# Patient Record
Sex: Female | Born: 1940 | Race: White | Hispanic: No | Smoking: Former smoker
Health system: Southern US, Community
[De-identification: ages and names within clinical notes are randomized; demographics above are authoritative.]

## PROBLEM LIST (undated history)

## (undated) DIAGNOSIS — I10 Essential (primary) hypertension: Secondary | ICD-10-CM

## (undated) DIAGNOSIS — E7800 Pure hypercholesterolemia, unspecified: Secondary | ICD-10-CM

## (undated) HISTORY — PX: TUBAL LIGATION: SHX77

## (undated) HISTORY — PX: APPENDECTOMY: SHX54

## (undated) HISTORY — PX: DILATION AND CURETTAGE OF UTERUS: SHX78

## (undated) HISTORY — PX: CHOLECYSTECTOMY: SHX55

## (undated) HISTORY — PX: CARPAL TUNNEL RELEASE: SHX101

---

## 1999-08-11 ENCOUNTER — Ambulatory Visit (HOSPITAL_COMMUNITY): Admission: RE | Admit: 1999-08-11 | Discharge: 1999-08-11 | Payer: Self-pay | Admitting: Cardiology

## 2001-06-16 ENCOUNTER — Encounter: Payer: Self-pay | Admitting: Family Medicine

## 2001-06-16 ENCOUNTER — Ambulatory Visit (HOSPITAL_COMMUNITY): Admission: RE | Admit: 2001-06-16 | Discharge: 2001-06-16 | Payer: Self-pay | Admitting: Family Medicine

## 2003-06-28 ENCOUNTER — Other Ambulatory Visit: Admission: RE | Admit: 2003-06-28 | Discharge: 2003-06-28 | Payer: Self-pay | Admitting: Obstetrics & Gynecology

## 2003-07-01 ENCOUNTER — Ambulatory Visit (HOSPITAL_COMMUNITY): Admission: RE | Admit: 2003-07-01 | Discharge: 2003-07-02 | Payer: Self-pay | Admitting: Obstetrics & Gynecology

## 2004-07-24 ENCOUNTER — Ambulatory Visit (HOSPITAL_COMMUNITY): Admission: RE | Admit: 2004-07-24 | Discharge: 2004-07-24 | Payer: Self-pay | Admitting: Family Medicine

## 2010-06-16 ENCOUNTER — Other Ambulatory Visit (HOSPITAL_COMMUNITY): Payer: Self-pay | Admitting: Family Medicine

## 2010-06-16 DIAGNOSIS — Z139 Encounter for screening, unspecified: Secondary | ICD-10-CM

## 2010-06-20 ENCOUNTER — Ambulatory Visit (HOSPITAL_COMMUNITY)
Admission: RE | Admit: 2010-06-20 | Discharge: 2010-06-20 | Disposition: A | Discharge status: Home / Self Care | Payer: MEDICARE | Source: Ambulatory Visit | Attending: Family Medicine | Admitting: Family Medicine

## 2010-06-20 DIAGNOSIS — Z1231 Encounter for screening mammogram for malignant neoplasm of breast: Secondary | ICD-10-CM | POA: Insufficient documentation

## 2010-06-20 DIAGNOSIS — Z139 Encounter for screening, unspecified: Secondary | ICD-10-CM

## 2011-06-18 ENCOUNTER — Other Ambulatory Visit (HOSPITAL_COMMUNITY): Payer: Self-pay | Admitting: Family Medicine

## 2011-06-18 DIAGNOSIS — Z139 Encounter for screening, unspecified: Secondary | ICD-10-CM

## 2011-06-22 ENCOUNTER — Ambulatory Visit (HOSPITAL_COMMUNITY)
Admission: RE | Admit: 2011-06-22 | Discharge: 2011-06-22 | Disposition: A | Discharge status: Home / Self Care | Payer: Medicare Other | Source: Ambulatory Visit | Attending: Family Medicine | Admitting: Family Medicine

## 2011-06-22 DIAGNOSIS — Z1231 Encounter for screening mammogram for malignant neoplasm of breast: Secondary | ICD-10-CM | POA: Insufficient documentation

## 2011-06-22 DIAGNOSIS — Z139 Encounter for screening, unspecified: Secondary | ICD-10-CM

## 2011-08-07 ENCOUNTER — Encounter (HOSPITAL_COMMUNITY): Payer: Self-pay

## 2011-08-07 ENCOUNTER — Encounter (HOSPITAL_COMMUNITY)
Admission: RE | Admit: 2011-08-07 | Discharge: 2011-08-07 | Disposition: A | Discharge status: Home / Self Care | Payer: Medicare Other | Source: Ambulatory Visit | Attending: Ophthalmology | Admitting: Ophthalmology

## 2011-08-07 HISTORY — DX: Essential (primary) hypertension: I10

## 2011-08-07 HISTORY — DX: Pure hypercholesterolemia, unspecified: E78.00

## 2011-08-07 LAB — BASIC METABOLIC PANEL
BUN: 14 mg/dL (ref 6–23)
Calcium: 9.7 mg/dL (ref 8.4–10.5)
Creatinine, Ser: 0.55 mg/dL (ref 0.50–1.10)
GFR calc Af Amer: >90 mL/min (ref >90)
GFR calc non Af Amer: >90 mL/min (ref >90)

## 2011-08-07 NOTE — Patient Instructions (Addendum)
22 Phyllis Santos  08/07/2011   Your procedure is scheduled on:  Thursday, 08/16/11  Report to [facility] at [place] [facility].  Call this number if you have problems the morning of surgery: [phone]   Remember:   Do not eat food:After Midnight.  May have clear liquids:until Midnight .  Clear liquids include soda, tea, black coffee, apple or grape juice, broth.  Take these medicines the morning of surgery with A SIP OF WATER: hydrodiuril,cozaar   Do not wear jewelry, make-up or nail polish.  Do not wear lotions, powders, or perfumes. You may wear deodorant.  Do not shave 48 hours prior to surgery.  Do not bring valuables to the hospital.  Contacts, dentures or bridgework may not be worn into surgery.  Leave suitcase in the car. After surgery it may be brought to your room.  For patients admitted to the hospital, checkout time is 11:00 AM the day of discharge.   Patients discharged the day of surgery will not be allowed to drive home.  Name and phone number of your driver: driver  Special Instructions: Use eye drops as instructed.   Please read over the following fact sheets that you were given: Pain Booklet, Anesthesia Post-op Instructions and Care and Recovery After Surgery   PATIENT INSTRUCTIONS POST-ANESTHESIA  IMMEDIATELY FOLLOWING SURGERY:  Do not drive or operate machinery for the first twenty four hours after surgery.  Do not make any important decisions for twenty four hours after surgery or while taking narcotic pain medications or sedatives.  If you develop intractable nausea and vomiting or a severe headache please notify your doctor immediately.  FOLLOW-UP:  Please make an appointment with your surgeon as instructed. You do not need to follow up with anesthesia unless specifically instructed to do so.  WOUND CARE INSTRUCTIONS (if applicable):  Keep a dry clean dressing on the anesthesia/puncture wound site if there is drainage.  Once the wound has quit draining you may leave it  open to air.  Generally you should leave the bandage intact for twenty four hours unless there is drainage.  If the epidural site drains for more than 36-48 hours please call the anesthesia department.  QUESTIONS?:  Please feel free to call your physician or the hospital operator if you have any questions, and they will be happy to assist you.          Cataract A cataract is a clouding of the lens of the eye. When a lens becomes cloudy, vision is reduced based on the degree and nature of the clouding. Many cataracts reduce vision to some degree. Some cataracts make people more near-sighted as they develop. Other cataracts increase glare. Cataracts that are ignored and become worse can sometimes look white. The white color can be seen through the pupil. CAUSES   Aging. However, cataracts may occur at any age, even in newborns.   Certain drugs.   Trauma to the eye.   Certain diseases such as diabetes.   Specific eye diseases such as chronic inflammation inside the eye or a sudden attack of a rare form of glaucoma.   Inherited or acquired medical problems.  SYMPTOMS   Gradual, progressive drop in vision in the affected eye.   Severe, rapid visual loss. This most often happens when trauma is the cause.  DIAGNOSIS  To detect a cataract, an eye doctor examines the lens. Cataracts are best diagnosed with an exam of the eyes with the pupils enlarged (dilated) by drops.  TREATMENT  For an early cataract, vision may improve by using different eyeglasses or stronger lighting. If that does not help your vision, surgery is the only effective treatment. A cataract needs to be surgically removed when vision loss interferes with your everyday activities, such as driving, reading, or watching TV. A cataract may also have to be removed if it prevents examination or treatment of another eye problem. Surgery removes the cloudy lens and usually replaces it with a substitute lens (intraocular lens, IOL).    At a time when both you and your doctor agree, the cataract will be surgically removed. If you have cataracts in both eyes, only one is usually removed at a time. This allows the operated eye to heal and be out of danger from any possible problems after surgery (such as infection or poor wound healing). In rare cases, a cataract may be doing damage to your eye. In these cases, your caregiver may advise surgical removal right away. The vast majority of people who have cataract surgery have better vision afterward. HOME CARE INSTRUCTIONS  If you are not planning surgery, you may be asked to do the following:  Use different eyeglasses.   Use stronger or brighter lighting.   Ask your eye doctor about reducing your medicine dose or changing medicines if it is thought that a medicine caused your cataract. Changing medicines does not make the cataract go away on its own.   Become familiar with your surroundings. Poor vision can lead to injury. Avoid bumping into things on the affected side. You are at a higher risk for tripping or falling.   Exercise extreme care when driving or operating machinery.   Wear sunglasses if you are sensitive to bright light or experiencing problems with glare.  SEEK IMMEDIATE MEDICAL CARE IF:   You have a worsening or sudden vision loss.   You notice redness, swelling, or increasing pain in the eye.   You have a fever.  Document Released: 04/30/2005 Document Revised: 04/19/2011 Document Reviewed: 12/22/2010 [facility] Patient Information 2012 [place], [place].

## 2011-08-07 NOTE — Progress Notes (Signed)
08/07/11 1052  OBSTRUCTIVE SLEEP APNEA  Have you ever been diagnosed with sleep apnea through a sleep study? No  Do you snore loudly (loud enough to be heard through closed doors)?  0  Do you often feel tired, fatigued, or sleepy during the daytime? 1  Has anyone observed you stop breathing during your sleep? 0  Do you have, or are you being treated for high blood pressure? 1  BMI more than 35 kg/m2? 1  Age over 69 years old? 1  Neck circumference greater than 40 cm/18 inches? 0  Gender: 0  Obstructive Sleep Apnea Score 4   Score 4 or greater  Updated health history

## 2011-08-08 NOTE — Pre-Procedure Instructions (Signed)
H & H and K 3.2 reported to Dr Jayme Cloud. No orders given.

## 2011-08-15 MED ORDER — TETRACAINE HCL 0.5 % OP SOLN
OPHTHALMIC | Status: AC
  Administered 2011-08-16: 1 [drp] via OPHTHALMIC
  Filled 2011-08-15: qty 2

## 2011-08-15 MED ORDER — CYCLOPENTOLATE-PHENYLEPHRINE 0.2-1 % OP SOLN
OPHTHALMIC | Status: AC
  Administered 2011-08-16: 1 [drp] via OPHTHALMIC
  Filled 2011-08-15: qty 2

## 2011-08-15 MED ORDER — LIDOCAINE HCL 3.5 % OP GEL
OPHTHALMIC | Status: AC
  Filled 2011-08-15: qty 5

## 2011-08-15 MED ORDER — LIDOCAINE HCL (PF) 1 % IJ SOLN
INTRAMUSCULAR | Status: AC
  Filled 2011-08-15: qty 2

## 2011-08-15 MED ORDER — NEOMYCIN-POLYMYXIN-DEXAMETH 3.5-10000-0.1 OP OINT
TOPICAL_OINTMENT | OPHTHALMIC | Status: AC
  Filled 2011-08-15: qty 3.5

## 2011-08-16 ENCOUNTER — Encounter (HOSPITAL_COMMUNITY)
Admission: RE | Disposition: A | Discharge status: Home / Self Care | Payer: Self-pay | Source: Ambulatory Visit | Attending: Ophthalmology

## 2011-08-16 ENCOUNTER — Encounter (HOSPITAL_COMMUNITY): Payer: Self-pay | Admitting: Anesthesiology

## 2011-08-16 ENCOUNTER — Encounter (HOSPITAL_COMMUNITY): Payer: Self-pay | Admitting: *Deleted

## 2011-08-16 ENCOUNTER — Ambulatory Visit (HOSPITAL_COMMUNITY): Payer: Medicare Other | Admitting: Anesthesiology

## 2011-08-16 ENCOUNTER — Ambulatory Visit (HOSPITAL_COMMUNITY)
Admission: RE | Admit: 2011-08-16 | Discharge: 2011-08-16 | Disposition: A | Discharge status: Home / Self Care | Payer: Medicare Other | Source: Ambulatory Visit | Attending: Ophthalmology | Admitting: Ophthalmology

## 2011-08-16 DIAGNOSIS — Z01812 Encounter for preprocedural laboratory examination: Secondary | ICD-10-CM | POA: Insufficient documentation

## 2011-08-16 DIAGNOSIS — I10 Essential (primary) hypertension: Secondary | ICD-10-CM | POA: Insufficient documentation

## 2011-08-16 DIAGNOSIS — Z01810 Encounter for preprocedural cardiovascular examination: Secondary | ICD-10-CM | POA: Insufficient documentation

## 2011-08-16 DIAGNOSIS — H2510 Age-related nuclear cataract, unspecified eye: Secondary | ICD-10-CM | POA: Insufficient documentation

## 2011-08-16 DIAGNOSIS — Z79899 Other long term (current) drug therapy: Secondary | ICD-10-CM | POA: Insufficient documentation

## 2011-08-16 HISTORY — PX: CATARACT EXTRACTION W/PHACO: SHX586

## 2011-08-16 SURGERY — PHACOEMULSIFICATION, CATARACT, WITH IOL INSERTION
Anesthesia: Monitor Anesthesia Care | Site: Eye | Laterality: Left | Wound class: Clean

## 2011-08-16 MED ORDER — EPINEPHRINE HCL 1 MG/ML IJ SOLN
INTRAOCULAR | Status: DC | PRN
  Administered 2011-08-16: 10:00:00

## 2011-08-16 MED ORDER — CYCLOPENTOLATE-PHENYLEPHRINE 0.2-1 % OP SOLN
1.0000 [drp] | OPHTHALMIC | Status: AC
  Administered 2011-08-16 (×3): 1 [drp] via OPHTHALMIC

## 2011-08-16 MED ORDER — MIDAZOLAM HCL 2 MG/2ML IJ SOLN
INTRAMUSCULAR | Status: AC
  Administered 2011-08-16: 2 mg via INTRAVENOUS
  Filled 2011-08-16: qty 2

## 2011-08-16 MED ORDER — ONDANSETRON HCL 4 MG/2ML IJ SOLN: 4.0000 mg | Freq: Once | INTRAMUSCULAR | Status: DC | PRN

## 2011-08-16 MED ORDER — TETRACAINE HCL 0.5 % OP SOLN
1.0000 [drp] | OPHTHALMIC | Status: AC
  Administered 2011-08-16 (×3): 1 [drp] via OPHTHALMIC

## 2011-08-16 MED ORDER — PHENYLEPHRINE HCL 2.5 % OP SOLN
1.0000 [drp] | OPHTHALMIC | Status: AC
  Administered 2011-08-16 (×3): 1 [drp] via OPHTHALMIC

## 2011-08-16 MED ORDER — LIDOCAINE HCL (PF) 1 % IJ SOLN
INTRAMUSCULAR | Status: DC | PRN
  Administered 2011-08-16: .3 mL

## 2011-08-16 MED ORDER — FENTANYL CITRATE 0.05 MG/ML IJ SOLN: 25.0000 ug | INTRAMUSCULAR | Status: DC | PRN

## 2011-08-16 MED ORDER — NEOMYCIN-POLYMYXIN-DEXAMETH 0.1 % OP OINT
TOPICAL_OINTMENT | OPHTHALMIC | Status: DC | PRN
  Administered 2011-08-16: 1 via OPHTHALMIC

## 2011-08-16 MED ORDER — EPINEPHRINE HCL 1 MG/ML IJ SOLN
INTRAMUSCULAR | Status: AC
  Filled 2011-08-16: qty 1

## 2011-08-16 MED ORDER — LACTATED RINGERS IV SOLN
INTRAVENOUS | Status: DC
  Administered 2011-08-16: 1000 mL via INTRAVENOUS

## 2011-08-16 MED ORDER — LIDOCAINE 3.5 % OP GEL OPTIME - NO CHARGE
OPHTHALMIC | Status: DC | PRN
  Administered 2011-08-16: 1 [drp] via OPHTHALMIC

## 2011-08-16 MED ORDER — MIDAZOLAM HCL 2 MG/2ML IJ SOLN: 1.0000 mg | INTRAMUSCULAR | Status: DC | PRN

## 2011-08-16 MED ORDER — POVIDONE-IODINE 5 % OP SOLN
OPHTHALMIC | Status: DC | PRN
  Administered 2011-08-16: 1 via OPHTHALMIC

## 2011-08-16 MED ORDER — PROVISC 10 MG/ML IO SOLN
INTRAOCULAR | Status: DC | PRN
  Administered 2011-08-16: .85 mL via INTRAOCULAR

## 2011-08-16 MED ORDER — PHENYLEPHRINE HCL 2.5 % OP SOLN
OPHTHALMIC | Status: AC
  Administered 2011-08-16: 1 [drp] via OPHTHALMIC
  Filled 2011-08-16: qty 2

## 2011-08-16 MED ORDER — MIDAZOLAM HCL 2 MG/2ML IJ SOLN
1.0000 mg | INTRAMUSCULAR | Status: DC | PRN
  Administered 2011-08-16: 2 mg via INTRAVENOUS

## 2011-08-16 MED ORDER — LACTATED RINGERS IV SOLN
INTRAVENOUS | Status: DC
  Administered 2011-08-16: 10:00:00 via INTRAVENOUS

## 2011-08-16 MED ORDER — LIDOCAINE HCL 3.5 % OP GEL: 1.0000 "application " | Freq: Once | OPHTHALMIC | Status: DC

## 2011-08-16 MED ORDER — BSS IO SOLN
INTRAOCULAR | Status: DC | PRN
  Administered 2011-08-16: 15 mL via INTRAOCULAR

## 2011-08-16 SURGICAL SUPPLY — 32 items

## 2011-08-16 NOTE — Anesthesia Postprocedure Evaluation (Signed)
  Anesthesia Post-op Note  Patient: Phyllis Santos  Procedure(s) Performed: Procedure(s) (LRB): CATARACT EXTRACTION PHACO AND INTRAOCULAR LENS PLACEMENT (IOC) (Left)  Patient Location: PACU and Short Stay  Anesthesia Type: MAC  Level of Consciousness: awake, alert , oriented and patient cooperative  Airway and Oxygen Therapy: Patient Spontanous Breathing  Post-op Pain: none  Post-op Assessment: Post-op Vital signs reviewed, Patient's Cardiovascular Status Stable, Respiratory Function Stable, Patent Airway and No signs of Nausea or vomiting  Post-op Vital Signs: Reviewed and stable  Complications: No apparent anesthesia complications

## 2011-08-16 NOTE — H&P (Signed)
I have reviewed the H&P, the patient was re-examined, and I have identified no interval changes in medical condition and plan of care since the history and physical of record  

## 2011-08-16 NOTE — Discharge Instructions (Signed)
MANILLA STRIETER  08/16/2011     Instructions  1. Use medications as Instructed.  Shake well before use. Wait 5 minutes between drops.  {OPHTHALMIC ANTIBIOTICS:22167} 4 times a day x 1 week.  {OPHTHALMIC ANTI-INFLAMMATORY:22168} 2 times a day x 4 weeks.  {OPHTHALMIC STEROID:22169} 4 times a day - week 1   3 times a day - Week 2, 2 times a day- Week 3, 1 time a day - Week 4.  2. Do not rub the operative eye. Do not swim underwater for 2 weeks.  3. You may remove the clear shield and resume your normal activities the day after  Surgery. Your eyes may feel more comfortable if you wear dark glasses outside.  4. Call our office at [phone] if you have sudden change in vision, extreme redness or pain. Some fluctuation in vision is normal after surgery. If you have an emergency after hours, call Dr. Alto Denver at [phone].  5. It is important that you attend all of your follow-up appointments.        Follow-up:{follow up:32580} with Gemma Payor, MD.   Dr. Lahoma Crocker: [phone]  Dr. Lita Mains: [phone]  Dr. Alto Denver: [phone]   If you find that you cannot contact your physician, but feel that your signs and   Symptoms warrant a physician's attention, call the Emergency Room at   [phone].   Other{NA AND WUXLKGMW:10272}.

## 2011-08-16 NOTE — Anesthesia Preprocedure Evaluation (Signed)
Anesthesia Evaluation  Patient identified by MRN, date of birth, ID band Patient awake    Reviewed: Allergy & Precautions, H&P , NPO status , Patient's Chart, lab work & pertinent test results  History of Anesthesia Complications Negative for: history of anesthetic complications  Airway Mallampati: I      Dental  (+) Edentulous Upper and Edentulous Lower   Pulmonary former smoker breath sounds clear to auscultation        Cardiovascular hypertension, Pt. on medications Rhythm:Regular Rate:Normal     Neuro/Psych    GI/Hepatic   Endo/Other    Renal/GU      Musculoskeletal   Abdominal   Peds  Hematology   Anesthesia Other Findings   Reproductive/Obstetrics                           Anesthesia Physical Anesthesia Plan  ASA: II  Anesthesia Plan: MAC   Post-op Pain Management:    Induction: Intravenous  Airway Management Planned: Nasal Cannula  Additional Equipment:   Intra-op Plan:   Post-operative Plan:   Informed Consent: I have reviewed the patients History and Physical, chart, labs and discussed the procedure including the risks, benefits and alternatives for the proposed anesthesia with the patient or authorized representative who has indicated his/her understanding and acceptance.     Plan Discussed with:   Anesthesia Plan Comments:         Anesthesia Quick Evaluation

## 2011-08-16 NOTE — Transfer of Care (Signed)
Immediate Anesthesia Transfer of Care Note  Patient: Phyllis Santos  Procedure(s) Performed: Procedure(s) (LRB): CATARACT EXTRACTION PHACO AND INTRAOCULAR LENS PLACEMENT (IOC) (Left)  Patient Location: PACU and Short Stay  Anesthesia Type: MAC  Level of Consciousness: awake, alert , oriented and patient cooperative  Airway & Oxygen Therapy: Patient Spontanous Breathing  Post-op Assessment: Report given to PACU RN, Post -op Vital signs reviewed and stable and Patient moving all extremities  Post vital signs: Reviewed and stable  Complications: No apparent anesthesia complications

## 2011-08-16 NOTE — Brief Op Note (Signed)
Pre-Op Dx: Cataract OS Post-Op Dx: Cataract OS Surgeon: Niki Cosman Anesthesia: Topical with MAC Implant: B&L enVista Specimen: None Complications: None 

## 2011-08-17 NOTE — Op Note (Signed)
[name], [name] NO.:  [id number]  MEDICAL RECORD NO.:  [id number]  LOCATION:  [facility]                          FACILITY:  [facility]  PHYSICIAN:  Susanne Greenhouse, MD       DATE OF BIRTH:  Jan 11, 1941  DATE OF PROCEDURE:  08/16/2011 DATE OF DISCHARGE:  08/16/2011                              OPERATIVE REPORT   PREOPERATIVE DIAGNOSIS:  Nuclear cataract, left eye, diagnosis code 366.16.  POSTOPERATIVE DIAGNOSIS:  Nuclear cataract, left eye, diagnosis code 366.16.  OPERATION PERFORMED:  Phacoemulsification with posterior chamber intraocular lens implantation, left eye.  SURGEON:  Susanne Greenhouse, MD  ANESTHESIA:  General endotracheal anesthesia.  OPERATIVE SUMMARY:  In the preoperative area, dilating drops were placed into the left eye.  The patient was then brought into the operating room where she was placed under general anesthesia.  The eye was then prepped and draped.  Beginning with a #75 blade, a paracentesis port was made at the surgeon's 2 o'clock position.  The anterior chamber was then filled with a 1% nonpreserved lidocaine solution with epinephrine.  This was followed by Viscoat to deepen the chamber.  A small fornix-based peritomy was performed superiorly.  Next, a single iris hook was placed through the limbus superiorly.  A 2.4-mm keratome blade was then used to make a clear corneal incision over the iris hook.  A bent cystotome needle and Utrata forceps were used to create a continuous tear capsulotomy.  Hydrodissection was performed using balanced salt solution on a fine cannula.  The lens nucleus was then removed using phacoemulsification in a quadrant cracking technique.  The cortical material was then removed with irrigation and aspiration.  The capsular bag and anterior chamber were refilled with Provisc.  The wound was widened to approximately 3 mm and a posterior chamber intraocular lens was placed into the capsular bag without difficulty using  an [employer] lens injecting system.  A single 10-0 nylon suture was then used to close the incision as well as stromal hydration.  The Provisc was removed from the anterior chamber and capsular bag with irrigation and aspiration.  At this point, the wounds were tested for leak, which were negative.  The anterior chamber remained deep and stable.  The patient tolerated the procedure well.  There were no operative complications, and she awoke from general anesthesia without problem.  No surgical specimens.  Prosthetic device used is a Cabin crew posterior chamber lens, model MX60, power of 20.0, serial number is [id number].          ______________________________ Susanne Greenhouse, MD     KEH/MEDQ  D:  08/16/2011  T:  08/17/2011  Job:  [id number]

## 2011-08-20 ENCOUNTER — Encounter (HOSPITAL_COMMUNITY): Payer: Self-pay | Admitting: Ophthalmology

## 2011-08-24 ENCOUNTER — Encounter (HOSPITAL_COMMUNITY): Payer: Self-pay | Admitting: Pharmacy Technician

## 2011-08-27 ENCOUNTER — Other Ambulatory Visit (HOSPITAL_COMMUNITY): Payer: Medicare Other

## 2011-08-28 ENCOUNTER — Encounter (HOSPITAL_COMMUNITY): Payer: Self-pay

## 2011-08-28 ENCOUNTER — Encounter (HOSPITAL_COMMUNITY)
Admission: RE | Admit: 2011-08-28 | Discharge: 2011-08-28 | Payer: Medicare Other | Source: Ambulatory Visit | Admitting: Ophthalmology

## 2011-08-31 MED ORDER — LIDOCAINE HCL 3.5 % OP GEL
OPHTHALMIC | Status: AC
  Filled 2011-08-31: qty 5

## 2011-08-31 MED ORDER — LIDOCAINE HCL (PF) 1 % IJ SOLN
INTRAMUSCULAR | Status: AC
  Filled 2011-08-31: qty 2

## 2011-08-31 MED ORDER — TETRACAINE HCL 0.5 % OP SOLN
OPHTHALMIC | Status: AC
  Filled 2011-08-31: qty 2

## 2011-08-31 MED ORDER — PHENYLEPHRINE HCL 2.5 % OP SOLN
OPHTHALMIC | Status: AC
  Filled 2011-08-31: qty 2

## 2011-08-31 MED ORDER — NEOMYCIN-POLYMYXIN-DEXAMETH 3.5-10000-0.1 OP OINT
TOPICAL_OINTMENT | OPHTHALMIC | Status: AC
  Filled 2011-08-31: qty 3.5

## 2011-08-31 MED ORDER — CYCLOPENTOLATE-PHENYLEPHRINE 0.2-1 % OP SOLN
OPHTHALMIC | Status: AC
  Filled 2011-08-31: qty 2

## 2011-09-03 ENCOUNTER — Ambulatory Visit (HOSPITAL_COMMUNITY): Payer: Medicare Other | Admitting: Anesthesiology

## 2011-09-03 ENCOUNTER — Encounter (HOSPITAL_COMMUNITY): Payer: Self-pay | Admitting: *Deleted

## 2011-09-03 ENCOUNTER — Encounter (HOSPITAL_COMMUNITY): Payer: Self-pay | Admitting: Anesthesiology

## 2011-09-03 ENCOUNTER — Ambulatory Visit (HOSPITAL_COMMUNITY)
Admission: RE | Admit: 2011-09-03 | Discharge: 2011-09-03 | Disposition: A | Discharge status: Home / Self Care | Payer: Medicare Other | Source: Ambulatory Visit | Attending: Ophthalmology | Admitting: Ophthalmology

## 2011-09-03 ENCOUNTER — Encounter (HOSPITAL_COMMUNITY)
Admission: RE | Disposition: A | Discharge status: Home / Self Care | Payer: Self-pay | Source: Ambulatory Visit | Attending: Ophthalmology

## 2011-09-03 DIAGNOSIS — H2510 Age-related nuclear cataract, unspecified eye: Secondary | ICD-10-CM | POA: Insufficient documentation

## 2011-09-03 DIAGNOSIS — Z79899 Other long term (current) drug therapy: Secondary | ICD-10-CM | POA: Insufficient documentation

## 2011-09-03 DIAGNOSIS — I10 Essential (primary) hypertension: Secondary | ICD-10-CM | POA: Insufficient documentation

## 2011-09-03 HISTORY — PX: CATARACT EXTRACTION W/PHACO: SHX586

## 2011-09-03 SURGERY — PHACOEMULSIFICATION, CATARACT, WITH IOL INSERTION
Anesthesia: Monitor Anesthesia Care | Site: Eye | Laterality: Right | Wound class: Clean

## 2011-09-03 MED ORDER — PHENYLEPHRINE HCL 2.5 % OP SOLN
1.0000 [drp] | OPHTHALMIC | Status: AC
  Administered 2011-09-03 (×3): 1 [drp] via OPHTHALMIC

## 2011-09-03 MED ORDER — LIDOCAINE HCL 3.5 % OP GEL
1.0000 "application " | Freq: Once | OPHTHALMIC | Status: AC
  Administered 2011-09-03: 1 via OPHTHALMIC

## 2011-09-03 MED ORDER — PROVISC 10 MG/ML IO SOLN
INTRAOCULAR | Status: DC | PRN
  Administered 2011-09-03: 8.5 mg via INTRAOCULAR

## 2011-09-03 MED ORDER — EPINEPHRINE HCL 1 MG/ML IJ SOLN
INTRAOCULAR | Status: DC | PRN
  Administered 2011-09-03: 09:00:00

## 2011-09-03 MED ORDER — EPINEPHRINE HCL 1 MG/ML IJ SOLN
INTRAMUSCULAR | Status: AC
  Filled 2011-09-03: qty 1

## 2011-09-03 MED ORDER — POVIDONE-IODINE 5 % OP SOLN
OPHTHALMIC | Status: DC | PRN
  Administered 2011-09-03: 1 via OPHTHALMIC

## 2011-09-03 MED ORDER — ONDANSETRON HCL 4 MG/2ML IJ SOLN: 4.0000 mg | Freq: Once | INTRAMUSCULAR | Status: DC | PRN

## 2011-09-03 MED ORDER — LACTATED RINGERS IV SOLN
INTRAVENOUS | Status: DC
  Administered 2011-09-03: 1000 mL via INTRAVENOUS

## 2011-09-03 MED ORDER — NEOMYCIN-POLYMYXIN-DEXAMETH 0.1 % OP OINT
TOPICAL_OINTMENT | OPHTHALMIC | Status: DC | PRN
  Administered 2011-09-03: 1 via OPHTHALMIC

## 2011-09-03 MED ORDER — BSS IO SOLN
INTRAOCULAR | Status: DC | PRN
  Administered 2011-09-03: 15 mL via INTRAOCULAR

## 2011-09-03 MED ORDER — TETRACAINE HCL 0.5 % OP SOLN
1.0000 [drp] | OPHTHALMIC | Status: AC
  Administered 2011-09-03 (×3): 1 [drp] via OPHTHALMIC

## 2011-09-03 MED ORDER — FENTANYL CITRATE 0.05 MG/ML IJ SOLN: 25.0000 ug | INTRAMUSCULAR | Status: DC | PRN

## 2011-09-03 MED ORDER — LIDOCAINE HCL (PF) 1 % IJ SOLN
INTRAMUSCULAR | Status: DC | PRN
  Administered 2011-09-03: .4 mL

## 2011-09-03 MED ORDER — LIDOCAINE 3.5 % OP GEL OPTIME - NO CHARGE
OPHTHALMIC | Status: DC | PRN
  Administered 2011-09-03: 2 [drp] via OPHTHALMIC

## 2011-09-03 MED ORDER — MIDAZOLAM HCL 2 MG/2ML IJ SOLN
INTRAMUSCULAR | Status: AC
  Administered 2011-09-03: 2 mg via INTRAVENOUS
  Filled 2011-09-03: qty 2

## 2011-09-03 MED ORDER — CYCLOPENTOLATE-PHENYLEPHRINE 0.2-1 % OP SOLN
1.0000 [drp] | OPHTHALMIC | Status: AC
  Administered 2011-09-03 (×3): 1 [drp] via OPHTHALMIC

## 2011-09-03 MED ORDER — MIDAZOLAM HCL 2 MG/2ML IJ SOLN
1.0000 mg | INTRAMUSCULAR | Status: DC | PRN
  Administered 2011-09-03: 2 mg via INTRAVENOUS

## 2011-09-03 SURGICAL SUPPLY — 32 items
CAPSULAR TENSION RING-AMO (OPHTHALMIC RELATED) IMPLANT
CLOTH BEACON ORANGE TIMEOUT ST (SAFETY) ×1 IMPLANT
EYE SHIELD UNIVERSAL CLEAR (GAUZE/BANDAGES/DRESSINGS) ×2 IMPLANT
GLOVE BIO SURGEON STRL SZ 6.5 (GLOVE) IMPLANT
GLOVE BIOGEL PI IND STRL 6.5 (GLOVE) IMPLANT
GLOVE BIOGEL PI IND STRL 7.0 (GLOVE) IMPLANT
GLOVE BIOGEL PI IND STRL 7.5 (GLOVE) IMPLANT
GLOVE BIOGEL PI INDICATOR 6.5 (GLOVE) ×1
GLOVE BIOGEL PI INDICATOR 7.0 (GLOVE)
GLOVE BIOGEL PI INDICATOR 7.5 (GLOVE)
GLOVE ECLIPSE 6.5 STRL STRAW (GLOVE) IMPLANT
GLOVE ECLIPSE 7.0 STRL STRAW (GLOVE) IMPLANT
GLOVE ECLIPSE 7.5 STRL STRAW (GLOVE) IMPLANT
GLOVE EXAM NITRILE LRG STRL (GLOVE) IMPLANT
GLOVE EXAM NITRILE MD LF STRL (GLOVE) ×1 IMPLANT
GLOVE SKINSENSE NS SZ6.5 (GLOVE)
GLOVE SKINSENSE NS SZ7.0 (GLOVE)
GLOVE SKINSENSE STRL SZ6.5 (GLOVE) IMPLANT
GLOVE SKINSENSE STRL SZ7.0 (GLOVE) IMPLANT
KIT VITRECTOMY (OPHTHALMIC RELATED) IMPLANT
PAD ARMBOARD 7.5X6 YLW CONV (MISCELLANEOUS) ×1 IMPLANT
PROC W NO LENS (INTRAOCULAR LENS)
PROC W SPEC LENS (INTRAOCULAR LENS)
PROCESS W NO LENS (INTRAOCULAR LENS) IMPLANT
PROCESS W SPEC LENS (INTRAOCULAR LENS) IMPLANT
RING MALYGIN (MISCELLANEOUS) IMPLANT
SIGHTPATH CAT PROC W REG LENS (Ophthalmic Related) ×2 IMPLANT
SYR TB 1ML LL NO SAFETY (SYRINGE) ×1 IMPLANT
TAPE SURG TRANSPORE 1 IN (GAUZE/BANDAGES/DRESSINGS) IMPLANT
TAPE SURGICAL TRANSPORE 1 IN (GAUZE/BANDAGES/DRESSINGS) ×1
VISCOELASTIC ADDITIONAL (OPHTHALMIC RELATED) IMPLANT
WATER STERILE IRR 250ML POUR (IV SOLUTION) ×1 IMPLANT

## 2011-09-03 NOTE — Transfer of Care (Signed)
Immediate Anesthesia Transfer of Care Note  Patient: Phyllis Santos  Procedure(s) Performed: Procedure(s) (LRB): CATARACT EXTRACTION PHACO AND INTRAOCULAR LENS PLACEMENT (IOC) (Right)  Patient Location: PACU and Short Stay  Anesthesia Type: MAC  Level of Consciousness: awake  Airway & Oxygen Therapy: Patient Spontanous Breathing  Post-op Assessment: Report given to PACU RN  Post vital signs: Reviewed and stable  Complications: No apparent anesthesia complications

## 2011-09-03 NOTE — Op Note (Signed)
Phyllis Santos, Phyllis Santos NO.:  [id number]  MEDICAL RECORD NO.:  [id number]  LOCATION:  [facility]                          FACILITY:  [facility]  PHYSICIAN:  Susanne Greenhouse, MD       DATE OF BIRTH:  09/24/1940  DATE OF PROCEDURE:  09/03/2011 DATE OF DISCHARGE:  09/03/2011                              OPERATIVE REPORT   PREOPERATIVE DIAGNOSIS:  Nuclear cataract, right eye, diagnosis code 366.16.  POSTOPERATIVE DIAGNOSIS:  Nuclear cataract, right eye, diagnosis code 366.16.  OPERATION PERFORMED:  Phacoemulsification with posterior chamber intraocular lens implantation, right eye.  SURGEON:  Bonne Dolores. Skyleigh Windle, MD  ANESTHESIA:  Topical with monitored anesthesia care..  OPERATIVE SUMMARY:  In the preoperative area, dilating drops were placed into the right eye.  The patient was then brought into the operating room where she was placed under general anesthesia.  The eye was then prepped and draped.  Beginning with a 75 blade, a paracentesis port was made at the surgeon's 2 o'clock position.  The anterior chamber was then filled with a 1% nonpreserved lidocaine solution with epinephrine.  This was followed by Viscoat to deepen the chamber.  A small fornix-based peritomy was performed superiorly.  Next, a single iris hook was placed through the limbus superiorly.  A 2.4-mm keratome blade was then used to make a clear corneal incision over the iris hook.  A bent cystotome needle and Utrata forceps were used to create a continuous tear capsulotomy.  Hydrodissection was performed using balanced salt solution on a fine cannula.  The lens nucleus was then removed using phacoemulsification in a quadrant cracking technique.  The cortical material was then removed with irrigation and aspiration.  The capsular bag and anterior chamber were refilled with Provisc.  The wound was widened to approximately 3 mm and a posterior chamber intraocular lens was placed into the capsular bag without  difficulty using an [employer] lens injecting system.  A single 10-0 nylon suture was then used to close the incision as well as stromal hydration.  The Provisc was removed from the anterior chamber and capsular bag with irrigation and aspiration.  At this point, the wounds were tested for leak, which were negative.  The anterior chamber remained deep and stable.  The patient tolerated the procedure well.  There were no operative complications, and she awoke from general anesthesia without problem.  There were no surgical specimens.  Prosthetic device used is a Cabin crew posterior chamber lens, model MX60, power of 20.0, serial number is [id number].          ______________________________ Susanne Greenhouse, MD     KEH/MEDQ  D:  09/03/2011  T:  09/03/2011  Job:  [id number]

## 2011-09-03 NOTE — Anesthesia Postprocedure Evaluation (Signed)
  Anesthesia Post-op Note  Patient: Phyllis Santos  Procedure(s) Performed: Procedure(s) (LRB): CATARACT EXTRACTION PHACO AND INTRAOCULAR LENS PLACEMENT (IOC) (Right)  Patient Location: PACU and Short Stay  Anesthesia Type: MAC  Level of Consciousness: awake, alert  and oriented  Airway and Oxygen Therapy: Patient Spontanous Breathing  Post-op Pain: none  Post-op Assessment: Post-op Vital signs reviewed, Patient's Cardiovascular Status Stable, Respiratory Function Stable, Patent Airway and No signs of Nausea or vomiting  Post-op Vital Signs: Reviewed and stable  Complications: No apparent anesthesia complications

## 2011-09-03 NOTE — Anesthesia Preprocedure Evaluation (Signed)
Anesthesia Evaluation  Patient identified by MRN, date of birth, ID band Patient awake    Reviewed: Allergy & Precautions, H&P , NPO status , Patient's Chart, lab work & pertinent test results  History of Anesthesia Complications Negative for: history of anesthetic complications  Airway Mallampati: I      Dental  (+) Edentulous Upper and Edentulous Lower   Pulmonary former smoker breath sounds clear to auscultation        Cardiovascular hypertension, Pt. on medications Rhythm:Regular Rate:Normal     Neuro/Psych    GI/Hepatic   Endo/Other    Renal/GU      Musculoskeletal   Abdominal   Peds  Hematology   Anesthesia Other Findings   Reproductive/Obstetrics                           Anesthesia Physical Anesthesia Plan  ASA: II  Anesthesia Plan: MAC   Post-op Pain Management:    Induction: Intravenous  Airway Management Planned: Nasal Cannula  Additional Equipment:   Intra-op Plan:   Post-operative Plan:   Informed Consent: I have reviewed the patients History and Physical, chart, labs and discussed the procedure including the risks, benefits and alternatives for the proposed anesthesia with the patient or authorized representative who has indicated his/her understanding and acceptance.     Plan Discussed with:   Anesthesia Plan Comments:         Anesthesia Quick Evaluation  

## 2011-09-03 NOTE — Brief Op Note (Signed)
Pre-Op Dx: Cataract OD Post-Op Dx: Cataract OD Surgeon: Aliahna Statzer Anesthesia: Topical with MAC Implant: B&L enVista Specimen: None Complications: None 

## 2011-09-03 NOTE — H&P (Signed)
I have reviewed the H&P, the patient was re-examined, and I have identified no interval changes in medical condition and plan of care since the history and physical of record  

## 2011-09-03 NOTE — Discharge Instructions (Signed)
Phyllis Santos  09/03/2011     Instructions  1. Use medications as Instructed.  Shake well before use. Wait 5 minutes between drops.  {OPHTHALMIC ANTIBIOTICS:22167} 4 times a day x 1 week.  {OPHTHALMIC ANTI-INFLAMMATORY:22168} 2 times a day x 4 weeks.  {OPHTHALMIC STEROID:22169} 4 times a day - week 1   3 times a day - Week 2, 2 times a day- Week 3, 1 time a day - Week 4.  2. Do not rub the operative eye. Do not swim underwater for 2 weeks.  3. You may remove the clear shield and resume your normal activities the day after  Surgery. Your eyes may feel more comfortable if you wear dark glasses outside.  4. Call our office at [phone] if you have sudden change in vision, extreme redness or pain. Some fluctuation in vision is normal after surgery. If you have an emergency after hours, call Dr. Alto Denver at [phone].  5. It is important that you attend all of your follow-up appointments.        Follow-up:{follow up:32580} with Gemma Payor, MD.   Dr. Lahoma Crocker: [phone]  Dr. Lita Mains: [phone]  Dr. Alto Denver: [phone]   If you find that you cannot contact your physician, but feel that your signs and   Symptoms warrant a physician's attention, call the Emergency Room at   [phone].   Other{NA AND WUXLKGMW:10272}.

## 2011-09-05 ENCOUNTER — Encounter (HOSPITAL_COMMUNITY): Payer: Self-pay | Admitting: Ophthalmology

## 2012-08-11 ENCOUNTER — Other Ambulatory Visit (HOSPITAL_COMMUNITY): Payer: Self-pay | Admitting: Family Medicine

## 2012-08-11 DIAGNOSIS — Z139 Encounter for screening, unspecified: Secondary | ICD-10-CM

## 2012-08-12 ENCOUNTER — Ambulatory Visit (HOSPITAL_COMMUNITY)
Admission: RE | Admit: 2012-08-12 | Discharge: 2012-08-12 | Disposition: A | Discharge status: Home / Self Care | Payer: Medicare Other | Source: Ambulatory Visit | Attending: Family Medicine | Admitting: Family Medicine

## 2012-08-12 DIAGNOSIS — Z139 Encounter for screening, unspecified: Secondary | ICD-10-CM

## 2012-08-12 DIAGNOSIS — Z1231 Encounter for screening mammogram for malignant neoplasm of breast: Secondary | ICD-10-CM | POA: Insufficient documentation

## 2012-08-14 ENCOUNTER — Other Ambulatory Visit (HOSPITAL_COMMUNITY): Payer: Self-pay | Admitting: Family Medicine

## 2012-08-14 DIAGNOSIS — M810 Age-related osteoporosis without current pathological fracture: Secondary | ICD-10-CM

## 2012-08-19 ENCOUNTER — Ambulatory Visit (HOSPITAL_COMMUNITY)
Admission: RE | Admit: 2012-08-19 | Discharge: 2012-08-19 | Disposition: A | Discharge status: Home / Self Care | Payer: Medicare Other | Source: Ambulatory Visit | Attending: Family Medicine | Admitting: Family Medicine

## 2012-08-19 DIAGNOSIS — Z13820 Encounter for screening for osteoporosis: Secondary | ICD-10-CM | POA: Insufficient documentation

## 2012-08-19 DIAGNOSIS — M810 Age-related osteoporosis without current pathological fracture: Secondary | ICD-10-CM

## 2012-08-19 DIAGNOSIS — M899 Disorder of bone, unspecified: Secondary | ICD-10-CM | POA: Insufficient documentation

## 2012-08-19 DIAGNOSIS — Z780 Asymptomatic menopausal state: Secondary | ICD-10-CM | POA: Insufficient documentation

## 2014-03-01 ENCOUNTER — Other Ambulatory Visit (HOSPITAL_COMMUNITY): Payer: Self-pay | Admitting: Family Medicine

## 2014-03-01 DIAGNOSIS — Z1231 Encounter for screening mammogram for malignant neoplasm of breast: Secondary | ICD-10-CM

## 2014-03-08 ENCOUNTER — Ambulatory Visit (HOSPITAL_COMMUNITY)
Admission: RE | Admit: 2014-03-08 | Discharge: 2014-03-08 | Disposition: A | Discharge status: Home / Self Care | Payer: Medicare Other | Source: Ambulatory Visit | Attending: Family Medicine | Admitting: Family Medicine

## 2014-03-08 DIAGNOSIS — Z1231 Encounter for screening mammogram for malignant neoplasm of breast: Secondary | ICD-10-CM | POA: Insufficient documentation

## 2016-04-17 ENCOUNTER — Telehealth: Payer: Self-pay

## 2016-04-17 NOTE — Telephone Encounter (Signed)
Pt called to say that her PCP had referred her to [place] for a colonoscopy. Please call [phone]

## 2016-04-24 ENCOUNTER — Telehealth: Payer: Self-pay

## 2016-04-24 NOTE — Telephone Encounter (Signed)
Pt has OV appt with Walden Field, NP on 04/27/2016 at 10:30 Am.

## 2016-04-24 NOTE — Telephone Encounter (Signed)
See original note of 04/17/2016.

## 2016-04-24 NOTE — Telephone Encounter (Signed)
I have received the referral and called and LMOM for a call from pt.

## 2016-04-24 NOTE — Telephone Encounter (Signed)
I called pt and she said Dr. Julianne Rice office was to send a referral. She had a positive Cologuard and needs colonoscopy.  I called Dr. Julianne Rice office and spoke to [place] and she will be faxing the referral over.

## 2016-04-27 ENCOUNTER — Encounter: Payer: Self-pay | Admitting: Nurse Practitioner

## 2016-04-27 ENCOUNTER — Other Ambulatory Visit: Payer: Self-pay

## 2016-04-27 ENCOUNTER — Ambulatory Visit (INDEPENDENT_AMBULATORY_CARE_PROVIDER_SITE_OTHER): Payer: Medicare Other | Admitting: Nurse Practitioner

## 2016-04-27 DIAGNOSIS — Z800 Family history of malignant neoplasm of digestive organs: Secondary | ICD-10-CM | POA: Diagnosis not present

## 2016-04-27 DIAGNOSIS — R195 Other fecal abnormalities: Secondary | ICD-10-CM

## 2016-04-27 MED ORDER — PEG 3350-KCL-NA BICARB-NACL 420 G PO SOLR: 4000.0000 mL | ORAL | 0 refills | Status: DC

## 2016-04-27 NOTE — Progress Notes (Signed)
Primary Care Physician:  Jani Gravel, MD Primary Gastroenterologist:  Dr. Gala Romney  Chief Complaint  Patient presents with  . Colonoscopy    HPI:   Phyllis Santos is a 75 y.o. female who presents On referral from primary care for positive Colocort test. No history of colonoscopy or endoscopy found in our system.  Today she states she's doing ok overall. She has never had a colonoscopy. States she;s terrified, feels like if she has a colonoscopy she'll leave this world. Reviewed FIT test, noted positive for blood. Denies abdominal pain, N/V, melena. Has seen toilet tissue hematochezia with hemorrhoid flares and straining with constipation. Denies unintnetional weight loss, fever, chills. Has a bowel movement about once a day, occasionally (1-2 times a month) will go every other day; diet dependent. Constipation is rare. Will use Preparation H and/or Miralax if she has a bout of constipation with hemorrhoid flares. Denies chest pain, dyspnea, dizziness, lightheadedness, syncope, near syncope. Denies any other upper or lower GI symptoms.  Past Medical History:  Diagnosis Date  . Hypercholesterolemia   . Hypertension     Past Surgical History:  Procedure Laterality Date  . APPENDECTOMY     Dr Wonda Olds  . CARPAL TUNNEL RELEASE     right-Dr Luna Glasgow  . CATARACT EXTRACTION W/PHACO  08/16/2011   Procedure: CATARACT EXTRACTION PHACO AND INTRAOCULAR LENS PLACEMENT (IOC);  Surgeon: Tonny Branch, MD;  Location: [facility];  Service: Ophthalmology;  Laterality: Left;  CDE: 12.45  . CATARACT EXTRACTION W/PHACO  09/03/2011   Procedure: CATARACT EXTRACTION PHACO AND INTRAOCULAR LENS PLACEMENT (IOC);  Surgeon: Tonny Branch, MD;  Location: [facility];  Service: Ophthalmology;  Laterality: Right;  CDE:13.47  . CHOLECYSTECTOMY     [facility]  . DILATION AND CURETTAGE OF UTERUS     Dr Elonda Husky 3-4 yrs ago  . TUBAL LIGATION     Dr Wonda Olds    Current Outpatient Prescriptions  Medication Sig Dispense Refill  .  aspirin EC 81 MG tablet Take 81 mg by mouth daily.    Marland Kitchen atorvastatin (LIPITOR) 10 MG tablet Take 10 mg by mouth daily.    . Calcium Carbonate-Vitamin D (CALCIUM 600+D) 600-400 MG-UNIT per tablet Take 1 tablet by mouth daily.    . hydrochlorothiazide (HYDRODIURIL) 25 MG tablet Take 25 mg by mouth daily.    Marland Kitchen losartan (COZAAR) 50 MG tablet Take 50 mg by mouth daily.    . Carboxymeth-Glycerin-Polysorb (REFRESH OPTIVE ADVANCED) 0.5-1-0.5 % SOLN Place 1 drop into both eyes daily as needed. For burning eyes    . Naphazoline-Glycerin-Zinc Sulf 0.012-0.2-0.25 % SOLN Place 1 drop into both eyes daily as needed. For burning eyes     No current facility-administered medications for this visit.     Allergies as of 04/27/2016 - Review Complete 04/27/2016  Allergen Reaction Noted  . Codeine Other (See Comments) 08/07/2011  . Levaquin [levofloxacin in d5w] Other (See Comments) 08/16/2011  . Penicillins Rash 08/07/2011    Family History  Problem Relation Age of Onset  . Colon cancer Brother   . Anesthesia problems Neg Hx   . Hypotension Neg Hx   . Malignant hyperthermia Neg Hx   . Pseudochol deficiency Neg Hx     Social History   Social History  . Marital status: Married    Spouse name: N/A  . Number of children: N/A  . Years of education: N/A   Occupational History  . Not on file.   Social History Main Topics  . Smoking status:  Former Smoker    Packs/day: 0.50    Years: 10.00    Types: Cigarettes    Quit date: 08/07/1994  . Smokeless tobacco: Not on file  . Alcohol use No  . Drug use: No  . Sexual activity: Yes    Birth control/ protection: Post-menopausal   Other Topics Concern  . Not on file   Social History Narrative  . No narrative on file    Review of Systems: General: Negative for anorexia, weight loss, fever, chills, fatigue, weakness. Eyes: Negative for vision changes.  ENT: Negative for hoarseness, difficulty swallowing , nasal congestion. CV: Negative for chest  pain, angina, palpitations, dyspnea on exertion, peripheral edema.  Respiratory: Negative for dyspnea at rest, dyspnea on exertion, cough, sputum, wheezing.  GI: See history of present illness. Derm: Negative for rash or itching.  Endo: Negative for unusual weight change.  Heme: Negative for bruising or bleeding. Allergy: Negative for rash or hives.    Physical Exam: BP 137/66   Pulse 70   Temp 98.3 F (36.8 C) (Oral)   Ht 5\' 2"  (1.575 m)   Wt 217 lb (98.4 kg)   BMI 39.69 kg/m  General:   Morbidly obese female. Alert and oriented. Pleasant and cooperative. Well-nourished and well-developed.  Head:  Normocephalic and atraumatic. Eyes:  Without icterus, sclera clear and conjunctiva pink.  Ears:  Normal auditory acuity. Cardiovascular:  S1, S2 present without murmurs appreciated. Respiratory:  Clear to auscultation bilaterally. No wheezes, rales, or rhonchi. No distress.  Gastrointestinal:  +BS, obese but soft, non-tender and non-distended. No HSM noted. No guarding or rebound. No masses appreciated.  Rectal:  Deferred  Musculoskalatal:  Symmetrical without gross deformities. Neurologic:  Alert and oriented x4;  grossly normal neurologically. Psych:  Alert and cooperative. Normal mood and affect. Heme/Lymph/Immune: No excessive bruising noted.    04/27/2016 11:02 AM   Disclaimer: This note was dictated with voice recognition software. Similar sounding words can inadvertently be transcribed and may not be corrected upon review.

## 2016-04-27 NOTE — Assessment & Plan Note (Signed)
Patient noted with heme positive stool on home FIT test completed by house calls visiting nurse practitioner. The patient also has a family history of colon cancer in her brother who was diagnosed in his early 74s, underwent treatment and is now still alive in his 95s. Generally asymptomatic from a GI standpoint other than occasional constipation and hemorrhoid flares which she monitors for herself. Feels her heme positive stools likely due to hemorrhoids. Discussed the possibility of multiple things going on and the strong recommendation that she undergo colonoscopy. Return for follow-up based on postprocedure recommendations.  Proceed with TCS with Dr. Gala Romney in near future: the risks, benefits, and alternatives have been discussed with the patient in detail. The patient states understanding and desires to proceed.  The patient is not on any anticoagulants, anxiolytics, chronic pain medications, or antidepressants. Conscious sedation should be adequate for her procedure.

## 2016-04-27 NOTE — Assessment & Plan Note (Signed)
Family history of colon cancer in her brother diagnosed in his 110s, underwent treatment still alive today in his 90s. She has 10 siblings in total and this is the only one with colon cancer. Given her high risk and positive Cologuard, strongly recommended that she undergo colonoscopy as she has never had one before. We'll plan for colonoscopy as noted above. Return for follow-up based on postprocedure recommendations.

## 2016-04-27 NOTE — Patient Instructions (Signed)
1. We will schedule your procedure for you. 2. Further recommendations to be made based on the results of your procedure. 3. Call us if you have any questions.

## 2016-04-30 ENCOUNTER — Other Ambulatory Visit: Payer: Self-pay

## 2016-04-30 DIAGNOSIS — R195 Other fecal abnormalities: Secondary | ICD-10-CM

## 2016-04-30 DIAGNOSIS — Z800 Family history of malignant neoplasm of digestive organs: Secondary | ICD-10-CM

## 2016-04-30 NOTE — Progress Notes (Signed)
cc'ed to pcp °

## 2016-04-30 NOTE — Patient Instructions (Signed)
PA# for TCS: [id number]

## 2016-05-18 ENCOUNTER — Ambulatory Visit (HOSPITAL_COMMUNITY)
Admission: RE | Admit: 2016-05-18 | Discharge: 2016-05-18 | Disposition: A | Discharge status: Home / Self Care | Payer: Medicare Other | Source: Ambulatory Visit | Attending: Internal Medicine | Admitting: Internal Medicine

## 2016-05-18 ENCOUNTER — Encounter (HOSPITAL_COMMUNITY): Payer: Self-pay | Admitting: *Deleted

## 2016-05-18 ENCOUNTER — Encounter (HOSPITAL_COMMUNITY)
Admission: RE | Disposition: A | Discharge status: Home / Self Care | Payer: Self-pay | Source: Ambulatory Visit | Attending: Internal Medicine

## 2016-05-18 DIAGNOSIS — Z800 Family history of malignant neoplasm of digestive organs: Secondary | ICD-10-CM

## 2016-05-18 DIAGNOSIS — E7800 Pure hypercholesterolemia, unspecified: Secondary | ICD-10-CM | POA: Insufficient documentation

## 2016-05-18 DIAGNOSIS — Z79899 Other long term (current) drug therapy: Secondary | ICD-10-CM | POA: Diagnosis not present

## 2016-05-18 DIAGNOSIS — K621 Rectal polyp: Secondary | ICD-10-CM | POA: Insufficient documentation

## 2016-05-18 DIAGNOSIS — K635 Polyp of colon: Secondary | ICD-10-CM | POA: Diagnosis not present

## 2016-05-18 DIAGNOSIS — K5730 Diverticulosis of large intestine without perforation or abscess without bleeding: Secondary | ICD-10-CM | POA: Diagnosis not present

## 2016-05-18 DIAGNOSIS — D128 Benign neoplasm of rectum: Secondary | ICD-10-CM

## 2016-05-18 DIAGNOSIS — D123 Benign neoplasm of transverse colon: Secondary | ICD-10-CM | POA: Insufficient documentation

## 2016-05-18 DIAGNOSIS — Z87891 Personal history of nicotine dependence: Secondary | ICD-10-CM | POA: Diagnosis not present

## 2016-05-18 DIAGNOSIS — Z7982 Long term (current) use of aspirin: Secondary | ICD-10-CM | POA: Insufficient documentation

## 2016-05-18 DIAGNOSIS — K921 Melena: Secondary | ICD-10-CM | POA: Insufficient documentation

## 2016-05-18 DIAGNOSIS — R195 Other fecal abnormalities: Secondary | ICD-10-CM

## 2016-05-18 DIAGNOSIS — I10 Essential (primary) hypertension: Secondary | ICD-10-CM | POA: Insufficient documentation

## 2016-05-18 HISTORY — PX: COLONOSCOPY: SHX5424

## 2016-05-18 SURGERY — COLONOSCOPY
Anesthesia: Moderate Sedation

## 2016-05-18 MED ORDER — MEPERIDINE HCL 100 MG/ML IJ SOLN
INTRAMUSCULAR | Status: AC
  Filled 2016-05-18: qty 2

## 2016-05-18 MED ORDER — MIDAZOLAM HCL 5 MG/5ML IJ SOLN
INTRAMUSCULAR | Status: AC
  Filled 2016-05-18: qty 10

## 2016-05-18 MED ORDER — MEPERIDINE HCL 100 MG/ML IJ SOLN
INTRAMUSCULAR | Status: DC | PRN
  Administered 2016-05-18: 50 mg via INTRAVENOUS
  Administered 2016-05-18: 25 mg via INTRAVENOUS

## 2016-05-18 MED ORDER — ONDANSETRON HCL 4 MG/2ML IJ SOLN
INTRAMUSCULAR | Status: DC
Start: 2016-05-18 — End: 2016-05-18
  Filled 2016-05-18: qty 2

## 2016-05-18 MED ORDER — ONDANSETRON HCL 4 MG/2ML IJ SOLN
INTRAMUSCULAR | Status: DC | PRN
  Administered 2016-05-18: 4 mg via INTRAVENOUS

## 2016-05-18 MED ORDER — SIMETHICONE 40 MG/0.6ML PO SUSP
ORAL | Status: DC | PRN
  Administered 2016-05-18: 2.5 mL

## 2016-05-18 MED ORDER — MIDAZOLAM HCL 5 MG/5ML IJ SOLN
INTRAMUSCULAR | Status: DC | PRN
  Administered 2016-05-18: 2 mg via INTRAVENOUS
  Administered 2016-05-18 (×3): 1 mg via INTRAVENOUS

## 2016-05-18 MED ORDER — SODIUM CHLORIDE 0.9 % IV SOLN: INTRAVENOUS | Status: DC

## 2016-05-18 NOTE — Op Note (Signed)
[facility] Patient Name: Phyllis Santos Procedure Date: 05/18/2016 2:22 PM MRN: [id number] Date of Birth: [birth year] Attending MD: Norvel Richards , MD CSN: [id number] Age: 76 Admit Type: Outpatient Procedure:                Colonoscopy with multiple snare polypectomy Indications:              Heme positive stool, Positive Cologuard test Providers:                Norvel Richards, MD, Charlyne Petrin RN, RN,                            Lurline Del, RN, Purcell Nails. Zebulon, Merchant navy officer Referring MD:             Teodora Medici. Kim Medicines:                Midazolam 5 mg IV, Meperidine 75 mg IV, Ondansetron                            4 mg IV Complications:            No immediate complications. Estimated Blood Loss:     Estimated blood loss was minimal. Procedure:                Pre-Anesthesia Assessment:                           - Prior to the procedure, a History and Physical                            was performed, and patient medications and                            allergies were reviewed. The patient's tolerance of                            previous anesthesia was also reviewed. The risks                            and benefits of the procedure and the sedation                            options and risks were discussed with the patient.                            All questions were answered, and informed consent                            was obtained. Prior Anticoagulants: The patient has                            taken no previous anticoagulant or antiplatelet                            agents. ASA Grade Assessment: II - A patient with  mild systemic disease. After reviewing the risks                            and benefits, the patient was deemed in                            satisfactory condition to undergo the procedure.                           After obtaining informed consent, the colonoscope                            was passed under  direct vision. Throughout the                            procedure, the patient's blood pressure, pulse, and                            oxygen saturations were monitored continuously. The                            colonoscopy was performed without difficulty. The                            patient tolerated the procedure well. The quality                            of the bowel preparation was adequate. The                            [id number] scope was introduced through                            the and advanced to the 5 cm into the ileum. The                            terminal ileum, ileocecal valve, appendiceal                            orifice, and rectum were photographed. Scope In: 3:02:04 PM Scope Out: 3:27:14 PM Scope Withdrawal Time: 0 hours 19 minutes 45 seconds  Total Procedure Duration: 0 hours 25 minutes 10 seconds  Findings:      The perianal and digital rectal examinations were normal.      A 7 mm polyp was found in the hepatic flexure. The polyp was       semi-pedunculated. The polyp was removed with a hot snare. Resection and       retrieval were complete. Estimated blood loss: none.      A 5 mm polyp was found in the rectum. The polyp was sessile. The polyp       was removed with a hot snare. Resection and retrieval were complete.      A 4 mmpolyp was found in the distal transverse colon. The polyp was       sessile. The polyp was removed  with a cold snare. Resection and       retrieval were complete. Estimated blood loss was minimal.      Multiple small and large-mouthed diverticula were found in the sigmoid       colon, descending colon and transverse colon.      The perianal and digital rectal examinations were normal.      No additional abnormalities were found on retroflexion. Impression:               - One 7 mm polyp at the hepatic flexure, removed                            with a hot snare. Resected and retrieved.                           - One  5 mm polyp in the rectum, removed with a hot                            snare. Resected and retrieved.                           - One polyp in the distal transverse colon, removed                            with a cold snare. Resected and retrieved.                           - Diverticulosis in the sigmoid colon, in the                            descending colon and in the transverse colon. Moderate Sedation:      Moderate (conscious) sedation was personally administered by an       anesthesia professional. The following parameters were monitored: oxygen       saturation, heart rate, blood pressure, respiratory rate, EKG, adequacy       of pulmonary ventilation, and response to care. Total physician       intraservice time was 30 minutes. Recommendation:           - Patient has a contact number available for                            emergencies. The signs and symptoms of potential                            delayed complications were discussed with the                            patient. Return to normal activities tomorrow.                            Written discharge instructions were provided to the                            patient.                           -  Resume previous diet.                           - Continue present medications.                           - Repeat colonoscopy date to be determined after                            pending pathology results are reviewed for                            surveillance based on pathology results.                           - Return to GI office (date not yet determined). Procedure Code(s):        --- Professional ---                           [phone], Colonoscopy, flexible; with removal of                            tumor(s), polyp(s), or other lesion(s) by snare                            technique Diagnosis Code(s):        --- Professional ---                           K62.1, Rectal polyp                           D12.3, Benign  neoplasm of transverse colon (hepatic                            flexure or splenic flexure)                           R19.5, Other fecal abnormalities                           K57.30, Diverticulosis of large intestine without                            perforation or abscess without bleeding CPT copyright 2016 American Medical Association. All rights reserved. The codes documented in this report are preliminary and upon coder review may  be revised to meet current compliance requirements. Cristopher Estimable. Dylann Layne, MD Norvel Richards, MD 05/18/2016 3:42:43 PM This report has been signed electronically. Number of Addenda: 0

## 2016-05-18 NOTE — Interval H&P Note (Signed)
History and Physical Interval Note:  05/18/2016 2:49 PM  Phyllis Santos  has presented today for surgery, with the diagnosis of heme positive stool, family history colon cancer  The various methods of treatment have been discussed with the patient and family. After consideration of risks, benefits and other options for treatment, the patient has consented to  Procedure(s) with comments: COLONOSCOPY (N/A) - 2:45pm as a surgical intervention .  The patient's history has been reviewed, patient examined, no change in status, stable for surgery.  I have reviewed the patient's chart and labs.  Questions were answered to the patient's satisfaction.     Phyllis Santos  No change. Diagnostic colonoscopy per plan.  The risks, benefits, limitations, alternatives and imponderables have been reviewed with the patient. Questions have been answered. All parties are agreeable.

## 2016-05-18 NOTE — H&P (View-Only) (Signed)
Primary Care Physician:  Jani Gravel, MD Primary Gastroenterologist:  Dr. Gala Romney  Chief Complaint  Patient presents with  . Colonoscopy    HPI:   Phyllis Santos is a 76 y.o. female who presents On referral from primary care for positive Colocort test. No history of colonoscopy or endoscopy found in our system.  Today she states she's doing ok overall. She has never had a colonoscopy. States she;s terrified, feels like if she has a colonoscopy she'll leave this world. Reviewed FIT test, noted positive for blood. Denies abdominal pain, N/V, melena. Has seen toilet tissue hematochezia with hemorrhoid flares and straining with constipation. Denies unintnetional weight loss, fever, chills. Has a bowel movement about once a day, occasionally (1-2 times a month) will go every other day; diet dependent. Constipation is rare. Will use Preparation H and/or Miralax if she has a bout of constipation with hemorrhoid flares. Denies chest pain, dyspnea, dizziness, lightheadedness, syncope, near syncope. Denies any other upper or lower GI symptoms.  Past Medical History:  Diagnosis Date  . Hypercholesterolemia   . Hypertension     Past Surgical History:  Procedure Laterality Date  . APPENDECTOMY     Dr Wonda Olds  . CARPAL TUNNEL RELEASE     right-Dr Luna Glasgow  . CATARACT EXTRACTION W/PHACO  08/16/2011   Procedure: CATARACT EXTRACTION PHACO AND INTRAOCULAR LENS PLACEMENT (IOC);  Surgeon: Tonny Branch, MD;  Location: [facility];  Service: Ophthalmology;  Laterality: Left;  CDE: 12.45  . CATARACT EXTRACTION W/PHACO  09/03/2011   Procedure: CATARACT EXTRACTION PHACO AND INTRAOCULAR LENS PLACEMENT (IOC);  Surgeon: Tonny Branch, MD;  Location: [facility];  Service: Ophthalmology;  Laterality: Right;  CDE:13.47  . CHOLECYSTECTOMY     [facility]  . DILATION AND CURETTAGE OF UTERUS     Dr Elonda Husky 3-4 yrs ago  . TUBAL LIGATION     Dr Wonda Olds    Current Outpatient Prescriptions  Medication Sig Dispense Refill  .  aspirin EC 81 MG tablet Take 81 mg by mouth daily.    Marland Kitchen atorvastatin (LIPITOR) 10 MG tablet Take 10 mg by mouth daily.    . Calcium Carbonate-Vitamin D (CALCIUM 600+D) 600-400 MG-UNIT per tablet Take 1 tablet by mouth daily.    . hydrochlorothiazide (HYDRODIURIL) 25 MG tablet Take 25 mg by mouth daily.    Marland Kitchen losartan (COZAAR) 50 MG tablet Take 50 mg by mouth daily.    . Carboxymeth-Glycerin-Polysorb (REFRESH OPTIVE ADVANCED) 0.5-1-0.5 % SOLN Place 1 drop into both eyes daily as needed. For burning eyes    . Naphazoline-Glycerin-Zinc Sulf 0.012-0.2-0.25 % SOLN Place 1 drop into both eyes daily as needed. For burning eyes     No current facility-administered medications for this visit.     Allergies as of 04/27/2016 - Review Complete 04/27/2016  Allergen Reaction Noted  . Codeine Other (See Comments) 08/07/2011  . Levaquin [levofloxacin in d5w] Other (See Comments) 08/16/2011  . Penicillins Rash 08/07/2011    Family History  Problem Relation Age of Onset  . Colon cancer Brother   . Anesthesia problems Neg Hx   . Hypotension Neg Hx   . Malignant hyperthermia Neg Hx   . Pseudochol deficiency Neg Hx     Social History   Social History  . Marital status: Married    Spouse name: N/A  . Number of children: N/A  . Years of education: N/A   Occupational History  . Not on file.   Social History Main Topics  . Smoking status:  Former Smoker    Packs/day: 0.50    Years: 10.00    Types: Cigarettes    Quit date: 08/07/1994  . Smokeless tobacco: Not on file  . Alcohol use No  . Drug use: No  . Sexual activity: Yes    Birth control/ protection: Post-menopausal   Other Topics Concern  . Not on file   Social History Narrative  . No narrative on file    Review of Systems: General: Negative for anorexia, weight loss, fever, chills, fatigue, weakness. Eyes: Negative for vision changes.  ENT: Negative for hoarseness, difficulty swallowing , nasal congestion. CV: Negative for chest  pain, angina, palpitations, dyspnea on exertion, peripheral edema.  Respiratory: Negative for dyspnea at rest, dyspnea on exertion, cough, sputum, wheezing.  GI: See history of present illness. Derm: Negative for rash or itching.  Endo: Negative for unusual weight change.  Heme: Negative for bruising or bleeding. Allergy: Negative for rash or hives.    Physical Exam: BP 137/66   Pulse 70   Temp 98.3 F (36.8 C) (Oral)   Ht 5\' 2"  (1.575 m)   Wt 217 lb (98.4 kg)   BMI 39.69 kg/m  General:   Morbidly obese female. Alert and oriented. Pleasant and cooperative. Well-nourished and well-developed.  Head:  Normocephalic and atraumatic. Eyes:  Without icterus, sclera clear and conjunctiva pink.  Ears:  Normal auditory acuity. Cardiovascular:  S1, S2 present without murmurs appreciated. Respiratory:  Clear to auscultation bilaterally. No wheezes, rales, or rhonchi. No distress.  Gastrointestinal:  +BS, obese but soft, non-tender and non-distended. No HSM noted. No guarding or rebound. No masses appreciated.  Rectal:  Deferred  Musculoskalatal:  Symmetrical without gross deformities. Neurologic:  Alert and oriented x4;  grossly normal neurologically. Psych:  Alert and cooperative. Normal mood and affect. Heme/Lymph/Immune: No excessive bruising noted.    04/27/2016 11:02 AM   Disclaimer: This note was dictated with voice recognition software. Similar sounding words can inadvertently be transcribed and may not be corrected upon review.

## 2016-05-18 NOTE — Discharge Instructions (Addendum)
Colonoscopy Discharge Instructions  Read the instructions outlined below and refer to this sheet in the next few weeks. These discharge instructions provide you with general information on caring for yourself after you leave the hospital. Your doctor may also give you specific instructions. While your treatment has been planned according to the most current medical practices available, unavoidable complications occasionally occur. If you have any problems or questions after discharge, call Dr. Gala Romney at [phone]. ACTIVITY  You may resume your regular activity, but move at a slower pace for the next 24 hours.   Take frequent rest periods for the next 24 hours.   Walking will help get rid of the air and reduce the bloated feeling in your belly (abdomen).   No driving for 24 hours (because of the medicine (anesthesia) used during the test).    Do not sign any important legal documents or operate any machinery for 24 hours (because of the anesthesia used during the test).  NUTRITION  Drink plenty of fluids.   You may resume your normal diet as instructed by your doctor.   Begin with a light meal and progress to your normal diet. Heavy or fried foods are harder to digest and may make you feel sick to your stomach (nauseated).   Avoid alcoholic beverages for 24 hours or as instructed.  MEDICATIONS  You may resume your normal medications unless your doctor tells you otherwise.  WHAT YOU CAN EXPECT TODAY  Some feelings of bloating in the abdomen.   Passage of more gas than usual.   Spotting of blood in your stool or on the toilet paper.  IF YOU HAD POLYPS REMOVED DURING THE COLONOSCOPY:  No aspirin products for 7 days or as instructed.   No alcohol for 7 days or as instructed.   Eat a soft diet for the next 24 hours.  FINDING OUT THE RESULTS OF YOUR TEST Not all test results are available during your visit. If your test results are not back during the visit, make an appointment  with your caregiver to find out the results. Do not assume everything is normal if you have not heard from your caregiver or the medical facility. It is important for you to follow up on all of your test results.  SEEK IMMEDIATE MEDICAL ATTENTION IF:  You have more than a spotting of blood in your stool.   Your belly is swollen (abdominal distention).   You are nauseated or vomiting.   You have a temperature over 101.   You have abdominal pain or discomfort that is severe or gets worse throughout the day.    Diverticulosis and polyp information provided  Further recommendations to follow pending review of pathology report.    Diverticulosis Diverticulosis is the condition that develops when small pouches (diverticula) form in the wall of your colon. Your colon, or large intestine, is where water is absorbed and stool is formed. The pouches form when the inside layer of your colon pushes through weak spots in the outer layers of your colon. CAUSES  No one knows exactly what causes diverticulosis. RISK FACTORS  Being older than 47. Your risk for this condition increases with age. Diverticulosis is rare in people younger than 40 years. By age 71, almost everyone has it.  Eating a low-fiber diet.  Being frequently constipated.  Being overweight.  Not getting enough exercise.  Smoking.  Taking over-the-counter pain medicines, like aspirin and ibuprofen. SYMPTOMS  Most people with diverticulosis do not have symptoms. DIAGNOSIS  Because diverticulosis often has no symptoms, health care providers often discover the condition during an exam for other colon problems. In many cases, a health care provider will diagnose diverticulosis while using a flexible scope to examine the colon (colonoscopy). TREATMENT  If you have never developed an infection related to diverticulosis, you may not need treatment. If you have had an infection before, treatment may include:  Eating more fruits,  vegetables, and grains.  Taking a fiber supplement.  Taking a live bacteria supplement (probiotic).  Taking medicine to relax your colon. HOME CARE INSTRUCTIONS   Drink at least 6-8 glasses of water each day to prevent constipation.  Try not to strain when you have a bowel movement.  Keep all follow-up appointments. If you have had an infection before:  Increase the fiber in your diet as directed by your health care provider or dietitian.  Take a dietary fiber supplement if your health care provider approves.  Only take medicines as directed by your health care provider. SEEK MEDICAL CARE IF:   You have abdominal pain.  You have bloating.  You have cramps.  You have not gone to the bathroom in 3 days. SEEK IMMEDIATE MEDICAL CARE IF:   Your pain gets worse.  Yourbloating becomes very bad.  You have a fever or chills, and your symptoms suddenly get worse.  You begin vomiting.  You have bowel movements that are bloody or black. MAKE SURE YOU:  Understand these instructions.  Will watch your condition.  Will get help right away if you are not doing well or get worse. This information is not intended to replace advice given to you by your health care provider. Make sure you discuss any questions you have with your health care provider. Document Released: 01/26/2004 Document Revised: 05/05/2013 Document Reviewed: 03/25/2013 Elsevier Interactive Patient Education  2017 [employer] [place].    Colon Polyps Introduction Polyps are tissue growths inside the body. Polyps can grow in many places, including the large intestine (colon). A polyp may be a round bump or a mushroom-shaped growth. You could have one polyp or several. Most colon polyps are noncancerous (benign). However, some colon polyps can become cancerous over time. What are the causes? The exact cause of colon polyps is not known. What increases the risk? This condition is more likely to develop in people  who:  Have a family history of colon cancer or colon polyps.  Are older than 82 or older than 45 if they are African American.  Have inflammatory bowel disease, such as ulcerative colitis or Crohn disease.  Are overweight.  Smoke cigarettes.  Do not get enough exercise.  Drink too much alcohol.  Eat a diet that is:  High in fat and red meat.  Low in fiber.  Had childhood cancer that was treated with abdominal radiation. What are the signs or symptoms? Most polyps do not cause symptoms. If you have symptoms, they may include:  Blood coming from your rectum when having a bowel movement.  Blood in your stool.The stool may look dark red or black.  A change in bowel habits, such as constipation or diarrhea. How is this diagnosed? This condition is diagnosed with a colonoscopy. This is a procedure that uses a lighted, flexible scope to look at the inside of your colon. How is this treated? Treatment for this condition involves removing any polyps that are found. Those polyps will then be tested for cancer. If cancer is found, your health care provider will talk to  options for colon cancer treatment. °Follow these instructions at home: °Diet °· Eat plenty of fiber, such as fruits, vegetables, and whole grains. °· Eat foods that are high in calcium and vitamin D, such as milk, cheese, yogurt, eggs, liver, fish, and broccoli. °· Limit foods high in fat, red meats, and processed meats, such as hot dogs, sausage, bacon, and lunch meats. °· Maintain a healthy weight, or lose weight if recommended by your health care provider. °General instructions °· Do not smoke cigarettes. °· Do not drink alcohol excessively. °· Keep all follow-up visits as told by your health care provider. This is important. This includes keeping regularly scheduled colonoscopies. Talk to your health care provider about when you need a colonoscopy. °· Exercise every day or as told by your health care  provider. °Contact a health care provider if: °· You have new or worsening bleeding during a bowel movement. °· You have new or increased blood in your stool. °· You have a change in bowel habits. °· You unexpectedly lose weight. °This information is not intended to replace advice given to you by your health care provider. Make sure you discuss any questions you have with your health care provider. °Document Released: 01/25/2004 Document Revised: 10/06/2015 Document Reviewed: 03/21/2015 °© 2017 Elsevier ° °

## 2016-05-22 ENCOUNTER — Encounter (HOSPITAL_COMMUNITY): Payer: Self-pay | Admitting: Internal Medicine

## 2016-05-26 ENCOUNTER — Encounter: Payer: Self-pay | Admitting: Internal Medicine

## 2016-06-27 DIAGNOSIS — I10 Essential (primary) hypertension: Secondary | ICD-10-CM | POA: Diagnosis not present

## 2016-06-27 DIAGNOSIS — E785 Hyperlipidemia, unspecified: Secondary | ICD-10-CM | POA: Diagnosis not present

## 2016-07-09 DIAGNOSIS — I10 Essential (primary) hypertension: Secondary | ICD-10-CM | POA: Diagnosis not present

## 2016-07-11 DIAGNOSIS — E785 Hyperlipidemia, unspecified: Secondary | ICD-10-CM | POA: Diagnosis not present

## 2016-07-11 DIAGNOSIS — I10 Essential (primary) hypertension: Secondary | ICD-10-CM | POA: Diagnosis not present

## 2016-07-11 DIAGNOSIS — Z0000 Encounter for general adult medical examination without abnormal findings: Secondary | ICD-10-CM | POA: Diagnosis not present

## 2017-01-08 DIAGNOSIS — I10 Essential (primary) hypertension: Secondary | ICD-10-CM | POA: Diagnosis not present

## 2017-01-10 DIAGNOSIS — I10 Essential (primary) hypertension: Secondary | ICD-10-CM | POA: Diagnosis not present

## 2017-01-10 DIAGNOSIS — E785 Hyperlipidemia, unspecified: Secondary | ICD-10-CM | POA: Diagnosis not present

## 2017-01-10 DIAGNOSIS — K5900 Constipation, unspecified: Secondary | ICD-10-CM | POA: Diagnosis not present

## 2017-07-09 DIAGNOSIS — E785 Hyperlipidemia, unspecified: Secondary | ICD-10-CM | POA: Diagnosis not present

## 2017-07-09 DIAGNOSIS — I10 Essential (primary) hypertension: Secondary | ICD-10-CM | POA: Diagnosis not present

## 2017-07-25 DIAGNOSIS — K5900 Constipation, unspecified: Secondary | ICD-10-CM | POA: Diagnosis not present

## 2017-07-25 DIAGNOSIS — E782 Mixed hyperlipidemia: Secondary | ICD-10-CM | POA: Diagnosis not present

## 2017-07-25 DIAGNOSIS — R7301 Impaired fasting glucose: Secondary | ICD-10-CM | POA: Diagnosis not present

## 2017-07-25 DIAGNOSIS — I10 Essential (primary) hypertension: Secondary | ICD-10-CM | POA: Diagnosis not present

## 2018-01-03 ENCOUNTER — Ambulatory Visit (HOSPITAL_COMMUNITY)
Admission: RE | Admit: 2018-01-03 | Discharge: 2018-01-03 | Disposition: A | Discharge status: Home / Self Care | Payer: Medicare Other | Source: Ambulatory Visit | Attending: Internal Medicine | Admitting: Internal Medicine

## 2018-01-03 ENCOUNTER — Other Ambulatory Visit: Payer: Self-pay | Admitting: Internal Medicine

## 2018-01-03 ENCOUNTER — Other Ambulatory Visit (HOSPITAL_COMMUNITY): Payer: Self-pay | Admitting: Internal Medicine

## 2018-01-03 DIAGNOSIS — N6452 Nipple discharge: Secondary | ICD-10-CM

## 2018-01-03 DIAGNOSIS — N644 Mastodynia: Secondary | ICD-10-CM

## 2018-01-03 DIAGNOSIS — R928 Other abnormal and inconclusive findings on diagnostic imaging of breast: Secondary | ICD-10-CM | POA: Diagnosis not present

## 2018-01-03 DIAGNOSIS — N63 Unspecified lump in unspecified breast: Secondary | ICD-10-CM

## 2018-01-03 DIAGNOSIS — N6459 Other signs and symptoms in breast: Secondary | ICD-10-CM | POA: Diagnosis not present

## 2018-01-07 ENCOUNTER — Ambulatory Visit (HOSPITAL_COMMUNITY)
Admission: RE | Admit: 2018-01-07 | Discharge: 2018-01-07 | Disposition: A | Discharge status: Home / Self Care | Payer: Medicare Other | Source: Ambulatory Visit | Attending: Internal Medicine | Admitting: Internal Medicine

## 2018-01-07 DIAGNOSIS — N6452 Nipple discharge: Secondary | ICD-10-CM | POA: Diagnosis not present

## 2018-01-07 DIAGNOSIS — N644 Mastodynia: Secondary | ICD-10-CM

## 2018-01-07 DIAGNOSIS — R922 Inconclusive mammogram: Secondary | ICD-10-CM | POA: Diagnosis not present

## 2018-01-28 DIAGNOSIS — E782 Mixed hyperlipidemia: Secondary | ICD-10-CM | POA: Diagnosis not present

## 2018-01-28 DIAGNOSIS — R7301 Impaired fasting glucose: Secondary | ICD-10-CM | POA: Diagnosis not present

## 2018-01-28 DIAGNOSIS — I10 Essential (primary) hypertension: Secondary | ICD-10-CM | POA: Diagnosis not present

## 2018-01-28 DIAGNOSIS — L219 Seborrheic dermatitis, unspecified: Secondary | ICD-10-CM | POA: Diagnosis not present

## 2018-01-28 DIAGNOSIS — Z0001 Encounter for general adult medical examination with abnormal findings: Secondary | ICD-10-CM | POA: Diagnosis not present

## 2018-02-04 ENCOUNTER — Other Ambulatory Visit (HOSPITAL_COMMUNITY): Payer: Self-pay | Admitting: Internal Medicine

## 2018-02-04 DIAGNOSIS — Z780 Asymptomatic menopausal state: Secondary | ICD-10-CM

## 2018-02-10 ENCOUNTER — Ambulatory Visit (HOSPITAL_COMMUNITY)
Admission: RE | Admit: 2018-02-10 | Discharge: 2018-02-10 | Disposition: A | Discharge status: Home / Self Care | Payer: Medicare Other | Source: Ambulatory Visit | Attending: Internal Medicine | Admitting: Internal Medicine

## 2018-02-10 DIAGNOSIS — R7301 Impaired fasting glucose: Secondary | ICD-10-CM | POA: Insufficient documentation

## 2018-02-10 DIAGNOSIS — E785 Hyperlipidemia, unspecified: Secondary | ICD-10-CM | POA: Insufficient documentation

## 2018-02-10 DIAGNOSIS — Z780 Asymptomatic menopausal state: Secondary | ICD-10-CM | POA: Insufficient documentation

## 2018-02-10 DIAGNOSIS — I10 Essential (primary) hypertension: Secondary | ICD-10-CM | POA: Insufficient documentation

## 2018-02-10 DIAGNOSIS — M85852 Other specified disorders of bone density and structure, left thigh: Secondary | ICD-10-CM | POA: Insufficient documentation

## 2018-02-26 DIAGNOSIS — R7301 Impaired fasting glucose: Secondary | ICD-10-CM | POA: Diagnosis not present

## 2018-02-26 DIAGNOSIS — I10 Essential (primary) hypertension: Secondary | ICD-10-CM | POA: Diagnosis not present

## 2018-02-26 DIAGNOSIS — E782 Mixed hyperlipidemia: Secondary | ICD-10-CM | POA: Diagnosis not present

## 2018-03-03 DIAGNOSIS — Z23 Encounter for immunization: Secondary | ICD-10-CM | POA: Diagnosis not present

## 2018-03-03 DIAGNOSIS — J069 Acute upper respiratory infection, unspecified: Secondary | ICD-10-CM | POA: Diagnosis not present

## 2018-03-14 DIAGNOSIS — J209 Acute bronchitis, unspecified: Secondary | ICD-10-CM | POA: Diagnosis not present

## 2018-03-14 DIAGNOSIS — I10 Essential (primary) hypertension: Secondary | ICD-10-CM | POA: Diagnosis not present

## 2018-07-21 DIAGNOSIS — J069 Acute upper respiratory infection, unspecified: Secondary | ICD-10-CM | POA: Diagnosis not present

## 2018-07-21 DIAGNOSIS — R05 Cough: Secondary | ICD-10-CM | POA: Diagnosis not present

## 2018-08-05 DIAGNOSIS — I10 Essential (primary) hypertension: Secondary | ICD-10-CM | POA: Diagnosis not present

## 2018-08-05 DIAGNOSIS — E782 Mixed hyperlipidemia: Secondary | ICD-10-CM | POA: Diagnosis not present

## 2018-08-05 DIAGNOSIS — R7301 Impaired fasting glucose: Secondary | ICD-10-CM | POA: Diagnosis not present

## 2018-08-05 DIAGNOSIS — K5900 Constipation, unspecified: Secondary | ICD-10-CM | POA: Diagnosis not present

## 2018-09-02 DIAGNOSIS — E782 Mixed hyperlipidemia: Secondary | ICD-10-CM | POA: Diagnosis not present

## 2018-09-02 DIAGNOSIS — R7301 Impaired fasting glucose: Secondary | ICD-10-CM | POA: Diagnosis not present

## 2018-09-02 DIAGNOSIS — I10 Essential (primary) hypertension: Secondary | ICD-10-CM | POA: Diagnosis not present

## 2018-09-11 DIAGNOSIS — Z0000 Encounter for general adult medical examination without abnormal findings: Secondary | ICD-10-CM | POA: Diagnosis not present

## 2018-11-04 DIAGNOSIS — R7301 Impaired fasting glucose: Secondary | ICD-10-CM | POA: Diagnosis not present

## 2018-11-04 DIAGNOSIS — E782 Mixed hyperlipidemia: Secondary | ICD-10-CM | POA: Diagnosis not present

## 2018-11-04 DIAGNOSIS — I10 Essential (primary) hypertension: Secondary | ICD-10-CM | POA: Diagnosis not present

## 2018-11-10 DIAGNOSIS — I10 Essential (primary) hypertension: Secondary | ICD-10-CM | POA: Diagnosis not present

## 2018-11-10 DIAGNOSIS — R7301 Impaired fasting glucose: Secondary | ICD-10-CM | POA: Diagnosis not present

## 2018-11-10 DIAGNOSIS — E782 Mixed hyperlipidemia: Secondary | ICD-10-CM | POA: Diagnosis not present

## 2018-11-17 DIAGNOSIS — I10 Essential (primary) hypertension: Secondary | ICD-10-CM | POA: Diagnosis not present

## 2018-11-17 DIAGNOSIS — R7301 Impaired fasting glucose: Secondary | ICD-10-CM | POA: Diagnosis not present

## 2018-11-17 DIAGNOSIS — E782 Mixed hyperlipidemia: Secondary | ICD-10-CM | POA: Diagnosis not present

## 2018-11-17 DIAGNOSIS — K5900 Constipation, unspecified: Secondary | ICD-10-CM | POA: Diagnosis not present

## 2018-11-17 DIAGNOSIS — L219 Seborrheic dermatitis, unspecified: Secondary | ICD-10-CM | POA: Diagnosis not present

## 2018-11-26 DIAGNOSIS — R600 Localized edema: Secondary | ICD-10-CM | POA: Diagnosis not present

## 2018-11-26 DIAGNOSIS — I10 Essential (primary) hypertension: Secondary | ICD-10-CM | POA: Diagnosis not present

## 2018-11-26 DIAGNOSIS — E782 Mixed hyperlipidemia: Secondary | ICD-10-CM | POA: Diagnosis not present

## 2018-11-26 DIAGNOSIS — R7301 Impaired fasting glucose: Secondary | ICD-10-CM | POA: Diagnosis not present

## 2018-11-26 DIAGNOSIS — Z634 Disappearance and death of family member: Secondary | ICD-10-CM | POA: Diagnosis not present

## 2018-12-16 ENCOUNTER — Other Ambulatory Visit (HOSPITAL_COMMUNITY): Payer: Self-pay | Admitting: Internal Medicine

## 2018-12-16 DIAGNOSIS — Z1231 Encounter for screening mammogram for malignant neoplasm of breast: Secondary | ICD-10-CM

## 2018-12-26 DIAGNOSIS — R600 Localized edema: Secondary | ICD-10-CM | POA: Diagnosis not present

## 2018-12-26 DIAGNOSIS — E782 Mixed hyperlipidemia: Secondary | ICD-10-CM | POA: Diagnosis not present

## 2018-12-26 DIAGNOSIS — I10 Essential (primary) hypertension: Secondary | ICD-10-CM | POA: Diagnosis not present

## 2018-12-26 DIAGNOSIS — R7301 Impaired fasting glucose: Secondary | ICD-10-CM | POA: Diagnosis not present

## 2019-01-12 ENCOUNTER — Other Ambulatory Visit: Payer: Self-pay

## 2019-01-12 ENCOUNTER — Ambulatory Visit (HOSPITAL_COMMUNITY)
Admission: RE | Admit: 2019-01-12 | Discharge: 2019-01-12 | Disposition: A | Discharge status: Home / Self Care | Payer: Medicare Other | Source: Ambulatory Visit | Attending: Internal Medicine | Admitting: Internal Medicine

## 2019-01-12 DIAGNOSIS — Z1231 Encounter for screening mammogram for malignant neoplasm of breast: Secondary | ICD-10-CM | POA: Diagnosis not present

## 2019-01-26 DIAGNOSIS — I10 Essential (primary) hypertension: Secondary | ICD-10-CM | POA: Diagnosis not present

## 2019-01-26 DIAGNOSIS — R7301 Impaired fasting glucose: Secondary | ICD-10-CM | POA: Diagnosis not present

## 2019-01-26 DIAGNOSIS — R600 Localized edema: Secondary | ICD-10-CM | POA: Diagnosis not present

## 2019-01-26 DIAGNOSIS — E782 Mixed hyperlipidemia: Secondary | ICD-10-CM | POA: Diagnosis not present

## 2019-02-04 DIAGNOSIS — R1084 Generalized abdominal pain: Secondary | ICD-10-CM | POA: Diagnosis not present

## 2019-02-07 DIAGNOSIS — R110 Nausea: Secondary | ICD-10-CM | POA: Diagnosis not present

## 2019-02-07 DIAGNOSIS — R42 Dizziness and giddiness: Secondary | ICD-10-CM | POA: Diagnosis not present

## 2019-02-09 DIAGNOSIS — Z712 Person consulting for explanation of examination or test findings: Secondary | ICD-10-CM | POA: Diagnosis not present

## 2019-02-09 DIAGNOSIS — R110 Nausea: Secondary | ICD-10-CM | POA: Diagnosis not present

## 2019-02-09 DIAGNOSIS — Z634 Disappearance and death of family member: Secondary | ICD-10-CM | POA: Diagnosis not present

## 2019-02-09 DIAGNOSIS — N6459 Other signs and symptoms in breast: Secondary | ICD-10-CM | POA: Diagnosis not present

## 2019-02-09 DIAGNOSIS — R42 Dizziness and giddiness: Secondary | ICD-10-CM | POA: Diagnosis not present

## 2019-02-09 DIAGNOSIS — I10 Essential (primary) hypertension: Secondary | ICD-10-CM | POA: Diagnosis not present

## 2019-02-09 DIAGNOSIS — Z0000 Encounter for general adult medical examination without abnormal findings: Secondary | ICD-10-CM | POA: Diagnosis not present

## 2019-02-19 ENCOUNTER — Other Ambulatory Visit: Payer: Self-pay | Admitting: Internal Medicine

## 2019-02-19 DIAGNOSIS — R1031 Right lower quadrant pain: Secondary | ICD-10-CM

## 2019-02-19 DIAGNOSIS — R1013 Epigastric pain: Secondary | ICD-10-CM

## 2019-02-25 DIAGNOSIS — R600 Localized edema: Secondary | ICD-10-CM | POA: Diagnosis not present

## 2019-02-25 DIAGNOSIS — I10 Essential (primary) hypertension: Secondary | ICD-10-CM | POA: Diagnosis not present

## 2019-02-25 DIAGNOSIS — R7301 Impaired fasting glucose: Secondary | ICD-10-CM | POA: Diagnosis not present

## 2019-02-25 DIAGNOSIS — E782 Mixed hyperlipidemia: Secondary | ICD-10-CM | POA: Diagnosis not present

## 2019-03-23 ENCOUNTER — Ambulatory Visit (HOSPITAL_COMMUNITY)
Admission: RE | Admit: 2019-03-23 | Discharge: 2019-03-23 | Disposition: A | Discharge status: Home / Self Care | Payer: Medicare Other | Source: Ambulatory Visit | Attending: Internal Medicine | Admitting: Internal Medicine

## 2019-03-23 ENCOUNTER — Other Ambulatory Visit: Payer: Self-pay

## 2019-03-23 DIAGNOSIS — R1031 Right lower quadrant pain: Secondary | ICD-10-CM | POA: Insufficient documentation

## 2019-03-23 DIAGNOSIS — R1013 Epigastric pain: Secondary | ICD-10-CM | POA: Insufficient documentation

## 2019-03-23 DIAGNOSIS — R109 Unspecified abdominal pain: Secondary | ICD-10-CM | POA: Diagnosis not present

## 2019-03-23 LAB — POCT I-STAT CREATININE: Creatinine, Ser: 0.7 mg/dL (ref 0.44–1.00)

## 2019-03-23 MED ORDER — IOHEXOL 300 MG/ML  SOLN
100.0000 mL | Freq: Once | INTRAMUSCULAR | Status: AC | PRN
  Administered 2019-03-23: 11:00:00 100 mL via INTRAVENOUS

## 2019-03-27 DIAGNOSIS — E782 Mixed hyperlipidemia: Secondary | ICD-10-CM | POA: Diagnosis not present

## 2019-03-27 DIAGNOSIS — I10 Essential (primary) hypertension: Secondary | ICD-10-CM | POA: Diagnosis not present

## 2019-03-27 DIAGNOSIS — R7301 Impaired fasting glucose: Secondary | ICD-10-CM | POA: Diagnosis not present

## 2019-03-30 DIAGNOSIS — R1084 Generalized abdominal pain: Secondary | ICD-10-CM | POA: Diagnosis not present

## 2019-04-29 DIAGNOSIS — E782 Mixed hyperlipidemia: Secondary | ICD-10-CM | POA: Diagnosis not present

## 2019-04-29 DIAGNOSIS — I10 Essential (primary) hypertension: Secondary | ICD-10-CM | POA: Diagnosis not present

## 2019-05-20 DIAGNOSIS — E782 Mixed hyperlipidemia: Secondary | ICD-10-CM | POA: Diagnosis not present

## 2019-05-20 DIAGNOSIS — R7301 Impaired fasting glucose: Secondary | ICD-10-CM | POA: Diagnosis not present

## 2019-05-20 DIAGNOSIS — I10 Essential (primary) hypertension: Secondary | ICD-10-CM | POA: Diagnosis not present

## 2019-05-25 DIAGNOSIS — E782 Mixed hyperlipidemia: Secondary | ICD-10-CM | POA: Diagnosis not present

## 2019-05-25 DIAGNOSIS — L219 Seborrheic dermatitis, unspecified: Secondary | ICD-10-CM | POA: Diagnosis not present

## 2019-05-25 DIAGNOSIS — R7301 Impaired fasting glucose: Secondary | ICD-10-CM | POA: Diagnosis not present

## 2019-05-25 DIAGNOSIS — K5900 Constipation, unspecified: Secondary | ICD-10-CM | POA: Diagnosis not present

## 2019-05-25 DIAGNOSIS — I10 Essential (primary) hypertension: Secondary | ICD-10-CM | POA: Diagnosis not present

## 2019-06-09 DIAGNOSIS — I10 Essential (primary) hypertension: Secondary | ICD-10-CM | POA: Diagnosis not present

## 2019-06-09 DIAGNOSIS — E7849 Other hyperlipidemia: Secondary | ICD-10-CM | POA: Diagnosis not present

## 2019-06-09 DIAGNOSIS — K5900 Constipation, unspecified: Secondary | ICD-10-CM | POA: Diagnosis not present

## 2019-09-14 DIAGNOSIS — S39012A Strain of muscle, fascia and tendon of lower back, initial encounter: Secondary | ICD-10-CM | POA: Diagnosis not present

## 2019-10-09 DIAGNOSIS — E7849 Other hyperlipidemia: Secondary | ICD-10-CM | POA: Diagnosis not present

## 2019-10-09 DIAGNOSIS — I10 Essential (primary) hypertension: Secondary | ICD-10-CM | POA: Diagnosis not present

## 2019-10-09 DIAGNOSIS — K5900 Constipation, unspecified: Secondary | ICD-10-CM | POA: Diagnosis not present

## 2019-11-18 DIAGNOSIS — I10 Essential (primary) hypertension: Secondary | ICD-10-CM | POA: Diagnosis not present

## 2019-11-18 DIAGNOSIS — K5900 Constipation, unspecified: Secondary | ICD-10-CM | POA: Diagnosis not present

## 2019-11-18 DIAGNOSIS — E7849 Other hyperlipidemia: Secondary | ICD-10-CM | POA: Diagnosis not present

## 2019-11-26 DIAGNOSIS — Z712 Person consulting for explanation of examination or test findings: Secondary | ICD-10-CM | POA: Diagnosis not present

## 2019-11-26 DIAGNOSIS — I10 Essential (primary) hypertension: Secondary | ICD-10-CM | POA: Diagnosis not present

## 2019-11-26 DIAGNOSIS — R7301 Impaired fasting glucose: Secondary | ICD-10-CM | POA: Diagnosis not present

## 2019-11-26 DIAGNOSIS — Z634 Disappearance and death of family member: Secondary | ICD-10-CM | POA: Diagnosis not present

## 2019-11-26 DIAGNOSIS — N6459 Other signs and symptoms in breast: Secondary | ICD-10-CM | POA: Diagnosis not present

## 2019-11-26 DIAGNOSIS — Z0000 Encounter for general adult medical examination without abnormal findings: Secondary | ICD-10-CM | POA: Diagnosis not present

## 2019-11-30 DIAGNOSIS — Z0001 Encounter for general adult medical examination with abnormal findings: Secondary | ICD-10-CM | POA: Diagnosis not present

## 2019-11-30 DIAGNOSIS — E782 Mixed hyperlipidemia: Secondary | ICD-10-CM | POA: Diagnosis not present

## 2019-11-30 DIAGNOSIS — L219 Seborrheic dermatitis, unspecified: Secondary | ICD-10-CM | POA: Diagnosis not present

## 2019-11-30 DIAGNOSIS — I10 Essential (primary) hypertension: Secondary | ICD-10-CM | POA: Diagnosis not present

## 2019-11-30 DIAGNOSIS — R7301 Impaired fasting glucose: Secondary | ICD-10-CM | POA: Diagnosis not present

## 2019-12-16 DIAGNOSIS — E7849 Other hyperlipidemia: Secondary | ICD-10-CM | POA: Diagnosis not present

## 2019-12-16 DIAGNOSIS — I10 Essential (primary) hypertension: Secondary | ICD-10-CM | POA: Diagnosis not present

## 2019-12-16 DIAGNOSIS — K5900 Constipation, unspecified: Secondary | ICD-10-CM | POA: Diagnosis not present

## 2020-01-11 DIAGNOSIS — I10 Essential (primary) hypertension: Secondary | ICD-10-CM | POA: Diagnosis not present

## 2020-01-11 DIAGNOSIS — K5900 Constipation, unspecified: Secondary | ICD-10-CM | POA: Diagnosis not present

## 2020-01-19 ENCOUNTER — Other Ambulatory Visit (HOSPITAL_COMMUNITY): Payer: Self-pay | Admitting: Internal Medicine

## 2020-01-19 DIAGNOSIS — Z1231 Encounter for screening mammogram for malignant neoplasm of breast: Secondary | ICD-10-CM

## 2020-01-21 ENCOUNTER — Other Ambulatory Visit: Payer: Self-pay

## 2020-01-21 ENCOUNTER — Ambulatory Visit (HOSPITAL_COMMUNITY)
Admission: RE | Admit: 2020-01-21 | Discharge: 2020-01-21 | Disposition: A | Discharge status: Home / Self Care | Payer: Medicare Other | Source: Ambulatory Visit | Attending: Internal Medicine | Admitting: Internal Medicine

## 2020-01-21 DIAGNOSIS — Z1231 Encounter for screening mammogram for malignant neoplasm of breast: Secondary | ICD-10-CM | POA: Diagnosis not present

## 2020-01-28 DIAGNOSIS — I10 Essential (primary) hypertension: Secondary | ICD-10-CM | POA: Diagnosis not present

## 2020-01-28 DIAGNOSIS — E7849 Other hyperlipidemia: Secondary | ICD-10-CM | POA: Diagnosis not present

## 2020-02-25 IMAGING — MG DIGITAL DIAGNOSTIC BILATERAL MAMMOGRAM WITH TOMO AND CAD
6 of 10 series · 6 of 30 positions shown · non-contrast
Comparison: Previous exam(s).

CLINICAL DATA: Patient describes a recent history of non
spontaneous RIGHT-sided nipple discharge.Currently being treated
with antibiotics. Patient denies palpable lump.

EXAM:
DIGITAL DIAGNOSTIC BILATERAL MAMMOGRAM WITH CAD AND TOMO

[R MLO synth-2D]
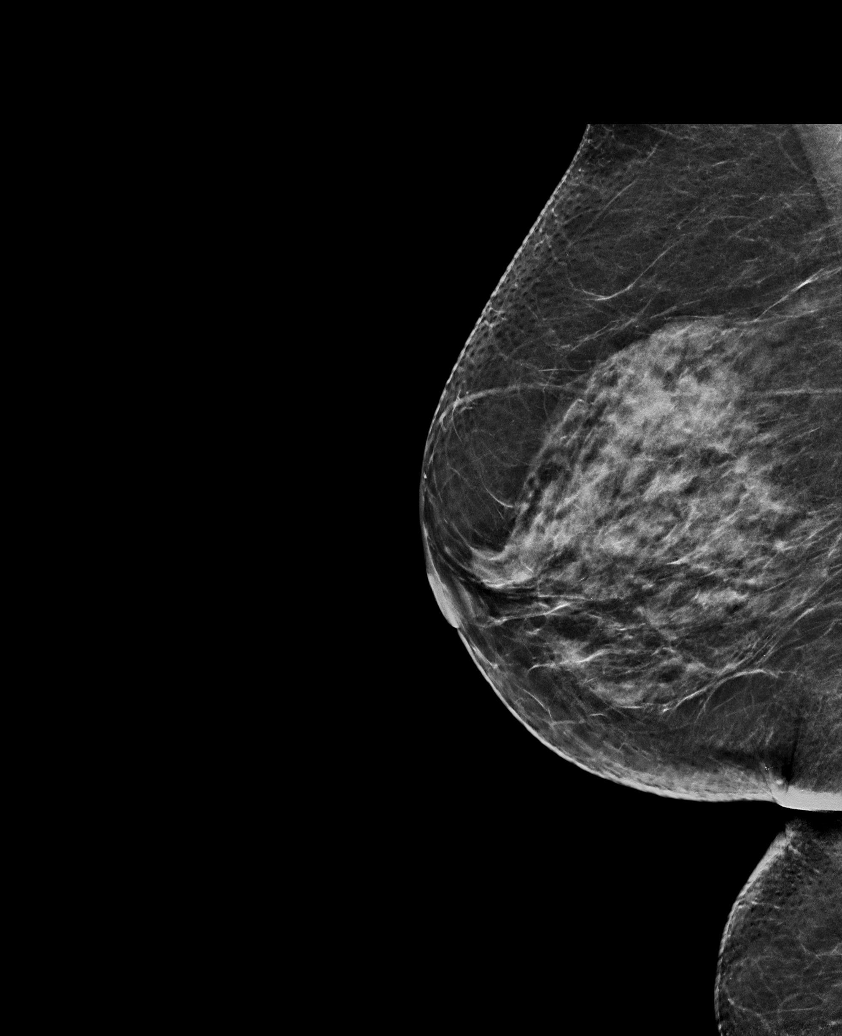

[L MLO synth-2D (1 of 2)]
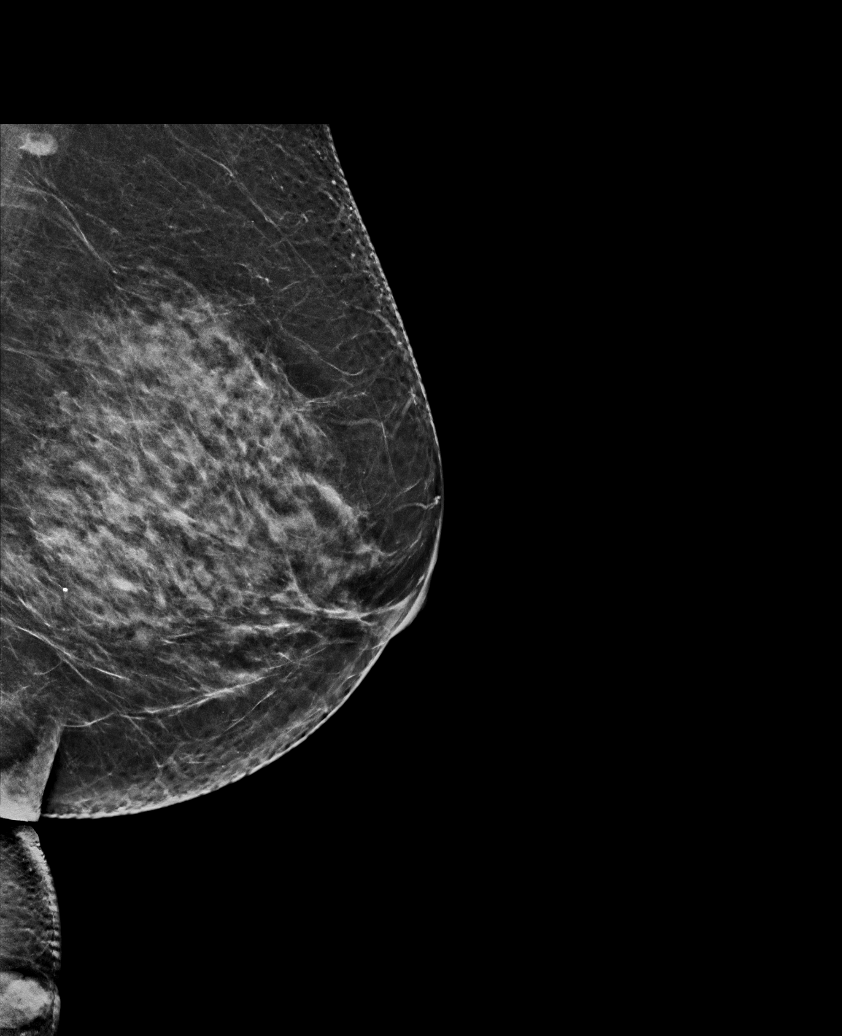

[L MLO synth-2D (2 of 2)]
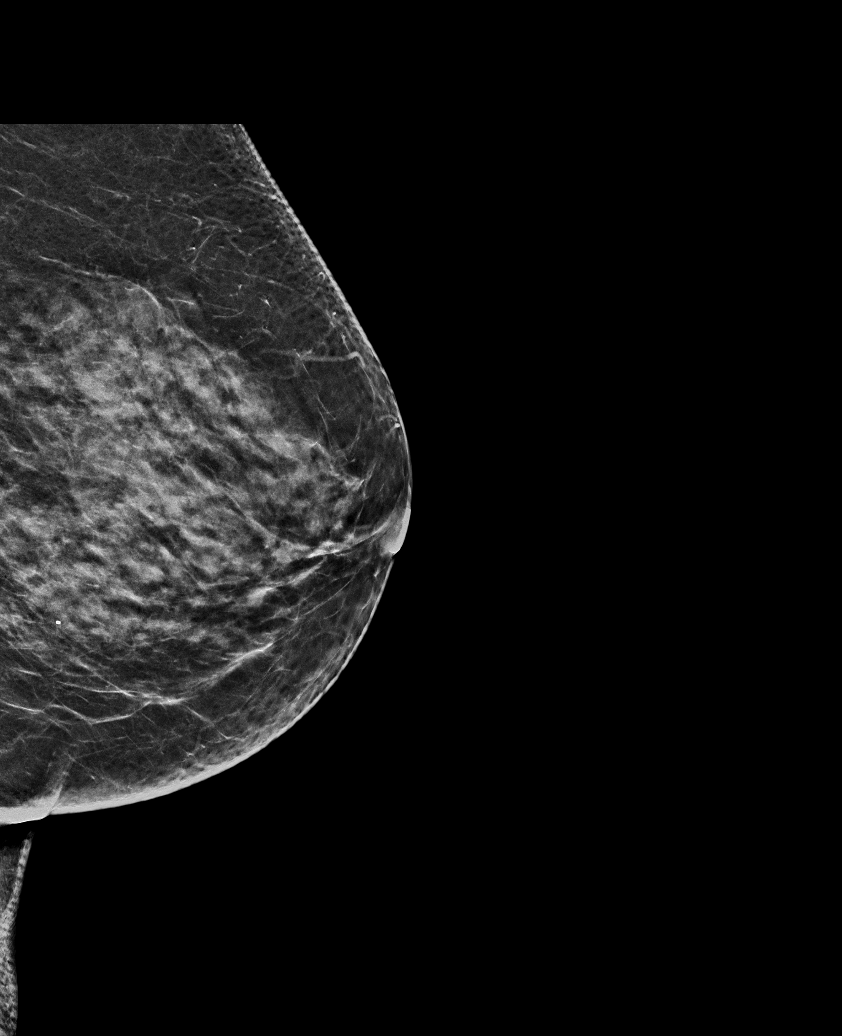

[R CC synth-2D]
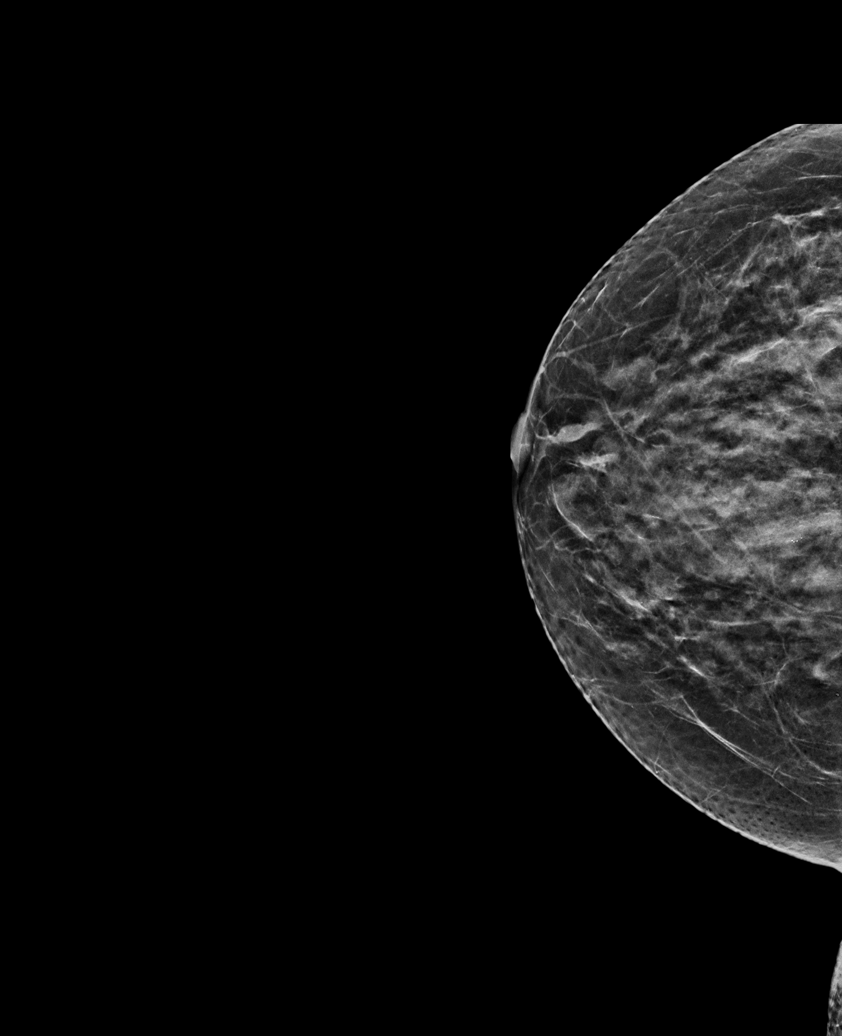

[L CC synth-2D]
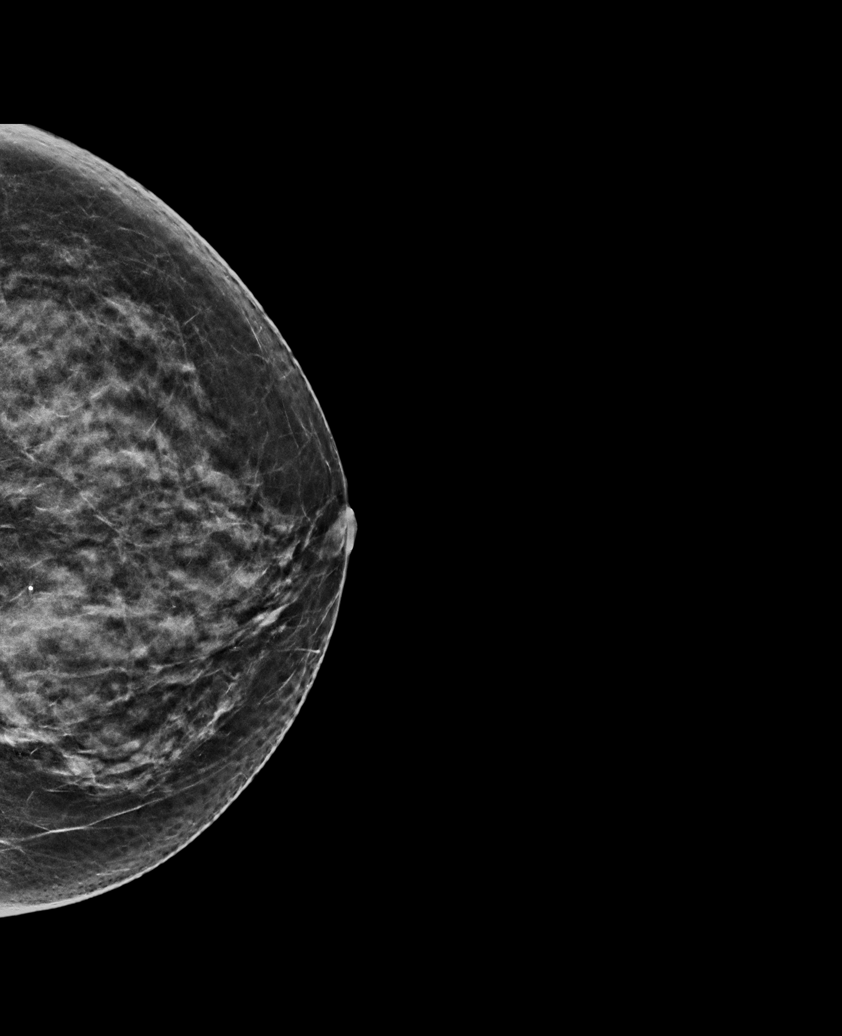

[L CC tomo · tomo slice 29/58.0]
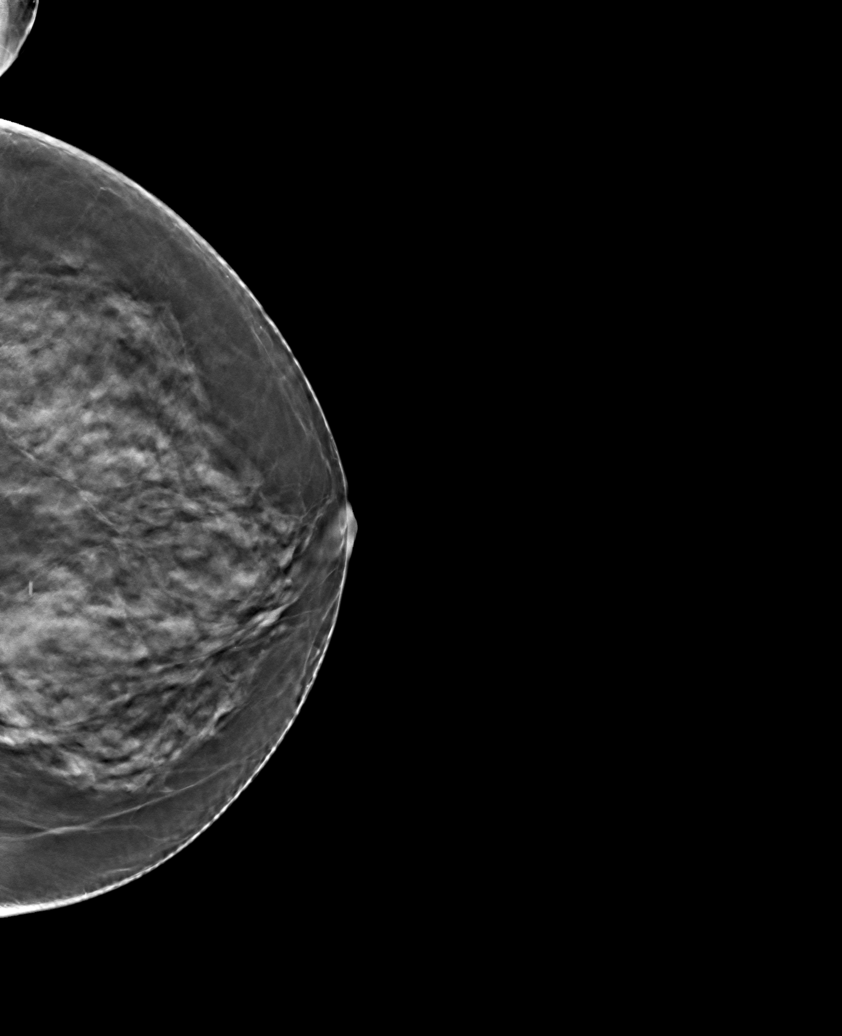

[6 of 30 positions shown; findings below may reference images not displayed]

ACR Breast Density Category c: The breast tissue is heterogeneously
dense, which may obscure small masses.
FINDINGS: Bilateral CC and MLO views were obtained today, with additional 3D
tomosynthesis. There are no new dominant masses, suspicious
calcifications or secondary signs of malignancy within either
breast. Specifically, there is no abnormality within the subareolar
region of the RIGHT breast corresponding to the area of clinical
concern.

Mammographic images were processed with CAD.
IMPRESSION: No evidence of malignancy within either breast. Specifically, no
abnormality within the RIGHT breast corresponding to the area of
clinical concern.

RECOMMENDATION:
1.  Screening mammogram in one year.(Code:[id number])
2. Causes of unilateral nipple discharge include: Hormonal changes,
fibrocystic changes, benign papilloma, abscess/mastitis, birth
control pills, endocrine disorders, injury/trauma to breast, duct
ectasia, medications, prolactinoma, and breast cancer. As is evident
from this list, nipple discharge often stems from a benign
condition, however, breast cancer is a possibility when unilateral
SPONTANEOUS PERSISTENT single duct discharge (especially bloody or
clear discharge) is present. Patient was encouraged to follow-up
with the primary care physician if any spontaneous nipple discharge,
especially bloody or clear discharge, was subsequently identified as
breast MRI would then be indicated. Patient was discouraged from
additional attempts to manually express the nipple discharge as this
could further irritate a possible benign duct ectasia/inflammation
resulting in increased discharge.
3. Patient was told to complete the current course of antibiotics.

I have discussed the findings and recommendations with the patient.
Results were also provided in writing at the conclusion of the
visit. If applicable, a reminder letter will be sent to the patient
regarding the next appointment.

BI-RADS CATEGORY  1: Negative.

## 2020-05-13 DIAGNOSIS — E782 Mixed hyperlipidemia: Secondary | ICD-10-CM | POA: Diagnosis not present

## 2020-05-13 DIAGNOSIS — E7849 Other hyperlipidemia: Secondary | ICD-10-CM | POA: Diagnosis not present

## 2020-05-13 DIAGNOSIS — I10 Essential (primary) hypertension: Secondary | ICD-10-CM | POA: Diagnosis not present

## 2020-06-08 DIAGNOSIS — R7301 Impaired fasting glucose: Secondary | ICD-10-CM | POA: Diagnosis not present

## 2020-06-08 DIAGNOSIS — I10 Essential (primary) hypertension: Secondary | ICD-10-CM | POA: Diagnosis not present

## 2020-06-08 DIAGNOSIS — Z634 Disappearance and death of family member: Secondary | ICD-10-CM | POA: Diagnosis not present

## 2020-06-08 DIAGNOSIS — Z712 Person consulting for explanation of examination or test findings: Secondary | ICD-10-CM | POA: Diagnosis not present

## 2020-06-08 DIAGNOSIS — N6459 Other signs and symptoms in breast: Secondary | ICD-10-CM | POA: Diagnosis not present

## 2020-06-08 DIAGNOSIS — Z0000 Encounter for general adult medical examination without abnormal findings: Secondary | ICD-10-CM | POA: Diagnosis not present

## 2020-06-13 ENCOUNTER — Other Ambulatory Visit (HOSPITAL_COMMUNITY): Payer: Self-pay | Admitting: Internal Medicine

## 2020-06-13 DIAGNOSIS — E782 Mixed hyperlipidemia: Secondary | ICD-10-CM | POA: Diagnosis not present

## 2020-06-13 DIAGNOSIS — I10 Essential (primary) hypertension: Secondary | ICD-10-CM | POA: Diagnosis not present

## 2020-06-13 DIAGNOSIS — L219 Seborrheic dermatitis, unspecified: Secondary | ICD-10-CM | POA: Diagnosis not present

## 2020-06-13 DIAGNOSIS — Z13820 Encounter for screening for osteoporosis: Secondary | ICD-10-CM

## 2020-06-13 DIAGNOSIS — K5900 Constipation, unspecified: Secondary | ICD-10-CM | POA: Diagnosis not present

## 2020-06-13 DIAGNOSIS — R600 Localized edema: Secondary | ICD-10-CM | POA: Diagnosis not present

## 2020-06-13 DIAGNOSIS — Z634 Disappearance and death of family member: Secondary | ICD-10-CM | POA: Diagnosis not present

## 2020-06-13 DIAGNOSIS — R7301 Impaired fasting glucose: Secondary | ICD-10-CM | POA: Diagnosis not present

## 2020-06-23 ENCOUNTER — Ambulatory Visit (HOSPITAL_COMMUNITY)
Admission: RE | Admit: 2020-06-23 | Discharge: 2020-06-23 | Disposition: A | Discharge status: Home / Self Care | Payer: Medicare Other | Source: Ambulatory Visit | Attending: Internal Medicine | Admitting: Internal Medicine

## 2020-06-23 ENCOUNTER — Other Ambulatory Visit: Payer: Self-pay

## 2020-06-23 DIAGNOSIS — M069 Rheumatoid arthritis, unspecified: Secondary | ICD-10-CM | POA: Insufficient documentation

## 2020-06-23 DIAGNOSIS — Z780 Asymptomatic menopausal state: Secondary | ICD-10-CM | POA: Insufficient documentation

## 2020-06-23 DIAGNOSIS — M85852 Other specified disorders of bone density and structure, left thigh: Secondary | ICD-10-CM | POA: Insufficient documentation

## 2020-06-23 DIAGNOSIS — Z13820 Encounter for screening for osteoporosis: Secondary | ICD-10-CM | POA: Diagnosis not present

## 2020-07-11 DIAGNOSIS — I10 Essential (primary) hypertension: Secondary | ICD-10-CM | POA: Diagnosis not present

## 2020-07-11 DIAGNOSIS — Z634 Disappearance and death of family member: Secondary | ICD-10-CM | POA: Diagnosis not present

## 2020-07-11 DIAGNOSIS — E782 Mixed hyperlipidemia: Secondary | ICD-10-CM | POA: Diagnosis not present

## 2020-08-10 DIAGNOSIS — Z634 Disappearance and death of family member: Secondary | ICD-10-CM | POA: Diagnosis not present

## 2020-08-10 DIAGNOSIS — I10 Essential (primary) hypertension: Secondary | ICD-10-CM | POA: Diagnosis not present

## 2020-08-10 DIAGNOSIS — E782 Mixed hyperlipidemia: Secondary | ICD-10-CM | POA: Diagnosis not present

## 2020-09-21 DIAGNOSIS — I10 Essential (primary) hypertension: Secondary | ICD-10-CM | POA: Diagnosis not present

## 2020-09-21 DIAGNOSIS — E785 Hyperlipidemia, unspecified: Secondary | ICD-10-CM | POA: Diagnosis not present

## 2020-11-08 ENCOUNTER — Other Ambulatory Visit (HOSPITAL_COMMUNITY): Payer: Self-pay | Admitting: Student

## 2020-11-08 ENCOUNTER — Other Ambulatory Visit: Payer: Self-pay

## 2020-11-08 ENCOUNTER — Ambulatory Visit (HOSPITAL_COMMUNITY)
Admission: RE | Admit: 2020-11-08 | Discharge: 2020-11-08 | Disposition: A | Discharge status: Home / Self Care | Payer: Medicare Other | Source: Ambulatory Visit | Attending: Student | Admitting: Student

## 2020-11-08 DIAGNOSIS — M25562 Pain in left knee: Secondary | ICD-10-CM

## 2020-11-10 DIAGNOSIS — I10 Essential (primary) hypertension: Secondary | ICD-10-CM | POA: Diagnosis not present

## 2020-11-10 DIAGNOSIS — E1165 Type 2 diabetes mellitus with hyperglycemia: Secondary | ICD-10-CM | POA: Diagnosis not present

## 2020-12-05 DIAGNOSIS — R7301 Impaired fasting glucose: Secondary | ICD-10-CM | POA: Diagnosis not present

## 2020-12-05 DIAGNOSIS — E782 Mixed hyperlipidemia: Secondary | ICD-10-CM | POA: Diagnosis not present

## 2020-12-12 DIAGNOSIS — M5442 Lumbago with sciatica, left side: Secondary | ICD-10-CM | POA: Diagnosis not present

## 2020-12-12 DIAGNOSIS — M8580 Other specified disorders of bone density and structure, unspecified site: Secondary | ICD-10-CM | POA: Diagnosis not present

## 2020-12-12 DIAGNOSIS — E782 Mixed hyperlipidemia: Secondary | ICD-10-CM | POA: Diagnosis not present

## 2020-12-12 DIAGNOSIS — R7301 Impaired fasting glucose: Secondary | ICD-10-CM | POA: Diagnosis not present

## 2020-12-12 DIAGNOSIS — Z0001 Encounter for general adult medical examination with abnormal findings: Secondary | ICD-10-CM | POA: Diagnosis not present

## 2020-12-12 DIAGNOSIS — I10 Essential (primary) hypertension: Secondary | ICD-10-CM | POA: Diagnosis not present

## 2020-12-12 DIAGNOSIS — R600 Localized edema: Secondary | ICD-10-CM | POA: Diagnosis not present

## 2020-12-12 DIAGNOSIS — M5441 Lumbago with sciatica, right side: Secondary | ICD-10-CM | POA: Diagnosis not present

## 2020-12-29 ENCOUNTER — Other Ambulatory Visit: Payer: Self-pay

## 2020-12-29 ENCOUNTER — Ambulatory Visit (HOSPITAL_COMMUNITY): Payer: Medicare Other | Attending: Dermatology | Admitting: Physical Therapy

## 2020-12-29 ENCOUNTER — Encounter (HOSPITAL_COMMUNITY): Payer: Self-pay | Admitting: Physical Therapy

## 2020-12-29 DIAGNOSIS — R2689 Other abnormalities of gait and mobility: Secondary | ICD-10-CM

## 2020-12-29 DIAGNOSIS — M6281 Muscle weakness (generalized): Secondary | ICD-10-CM

## 2020-12-29 DIAGNOSIS — M545 Low back pain, unspecified: Secondary | ICD-10-CM | POA: Diagnosis not present

## 2020-12-29 DIAGNOSIS — R29898 Other symptoms and signs involving the musculoskeletal system: Secondary | ICD-10-CM

## 2020-12-29 NOTE — Patient Instructions (Signed)
Access Code: [email or web address] URL: [email or web address] Date: 12/29/2020 Prepared by: Mitzi Hansen Pao Haffey  Exercises Hooklying Single Knee to Chest Stretch - 2-3 x daily - 7 x weekly - 5 reps - 10 second hold

## 2020-12-29 NOTE — Therapy (Signed)
[place], [place], [address] Phone: [phone]   Fax:  [phone]  Physical Therapy Evaluation  Patient Details  Name: Phyllis Santos MRN: [id number] Date of Birth: 02-18-1941 Referring Provider (PT): Allyn Kenner MD   Encounter Date: 12/29/2020   PT End of Session - 12/29/20 1055     Visit Number 1    Number of Visits 12    Date for PT Re-Evaluation 02/09/21    Authorization Type [employer] Medicare (no auth, no visit limit)    Progress Note Due on Visit 10    PT Start Time 1047    PT Stop Time 1120    PT Time Calculation (min) 33 min    Activity Tolerance Patient tolerated treatment well    Behavior During Therapy [facility] for tasks assessed/performed             Past Medical History:  Diagnosis Date   Hypercholesterolemia    Hypertension     Past Surgical History:  Procedure Laterality Date   APPENDECTOMY     Dr Wonda Olds   CARPAL TUNNEL RELEASE     right-Dr Luna Glasgow   CATARACT EXTRACTION [facility]  08/16/2011   Procedure: CATARACT EXTRACTION PHACO AND INTRAOCULAR LENS PLACEMENT ([place]);  Surgeon: Tonny Branch, MD;  Location: [facility] ORS;  Service: Ophthalmology;  Laterality: Left;  CDE: 12.45   CATARACT EXTRACTION W/PHACO  09/03/2011   Procedure: CATARACT EXTRACTION PHACO AND INTRAOCULAR LENS PLACEMENT ([facility]);  Surgeon: Tonny Branch, MD;  Location: [facility] ORS;  Service: Ophthalmology;  Laterality: Right;  CDE:13.47   CHOLECYSTECTOMY     [facility]   COLONOSCOPY N/A 05/18/2016   Procedure: COLONOSCOPY;  Surgeon: Daneil Dolin, MD;  Location: [facility] ENDO SUITE;  Service: Endoscopy;  Laterality: N/A;  2:45pm   DILATION AND CURETTAGE OF UTERUS     Dr Elonda Husky 3-4 yrs ago   TUBAL LIGATION     Dr Wonda Olds    There were no vitals filed for this visit.    Subjective Assessment - 12/29/20 1048     Subjective Patient is a 80 y.o. female who presents to physical therapy with c/o low back pain. Patient states pain from about halfway  down buttock down to her ankles. Symptoms began about 6 month ago with insidious onset. Symptoms only at night. She has difficulty with bending over and getting back up and has to walk her hands up her legs. She gets fluid in her legs but uses compression garments. Tylenol helps with the pain. Sometimes her back bothers her. Her main goals is to find out what's wrong with her legs.    Limitations Walking;Other (comment);House hold activities   sleeping, lying, walking hills   Patient Stated Goals figure out what's wrong with her legs    Currently in Pain? No/denies                [facility] PT Assessment - 12/29/20 0001       Assessment   Medical Diagnosis Lumbaro with sciatica bilateral    Referring Provider (PT) Allyn Kenner MD    Onset Date/Surgical Date 07/01/20    Next MD Visit Jan. 2023    Prior Therapy none      Precautions   Precautions None      Restrictions   Weight Bearing Restrictions No      Balance Screen   Has the patient fallen in the past 6 months No    Has the patient had a decrease in  activity level because of a fear of falling?  No    Is the patient reluctant to leave their home because of a fear of falling?  No      Prior Function   Level of Independence Independent      Cognition   Overall Cognitive Status Within Functional Limits for tasks assessed      Observation/Other Assessments   Observations Ambulates without AD, bilateral LE edema L >R    Focus on Therapeutic Outcomes (FOTO)  not uploaded      Sensation   Light Touch Appears Intact      ROM / Strength   AROM / PROM / Strength AROM;Strength      AROM   AROM Assessment Site Lumbar    Lumbar Flexion 0% limited    Lumbar Extension 25% limited    Lumbar - Right Side Bend 50% limited    Lumbar - Left Side Bend 50% limited    Lumbar - Right Rotation 50% limited    Lumbar - Left Rotation 50% limited      Strength   Strength Assessment Site Hip;Knee;Ankle    Right/Left Hip Right;Left    Right  Hip Flexion 3+/5    Left Hip Flexion 3+/5    Right/Left Knee Right;Left    Right Knee Flexion 5/5    Right Knee Extension 4+/5    Left Knee Flexion 4/5   pain in knee   Left Knee Extension 4/5   pain in knee   Right/Left Ankle Right;Left    Right Ankle Dorsiflexion 5/5    Left Ankle Dorsiflexion 4+/5      Palpation   Palpation comment TTP bilateral glutes, piriformis      Transfers   Comments labored      Ambulation/Gait   Ambulation/Gait Yes    Ambulation/Gait Assistance 6: Modified independent (Device/Increase time)    Ambulation Distance (Feet) 300 Feet    Assistive device None    Ambulation Surface Level;Unlevel    Gait Comments 2MWT, low back and knee pain throughout                        Objective measurements completed on examination: See above findings.       [facility] Adult PT Treatment/Exercise - 12/29/20 0001       Exercises   Exercises Lumbar      Lumbar Exercises: Stretches   Single Knee to Chest Stretch 5 reps;10 seconds;Right;Left                    PT Education - 12/29/20 1048     Education Details Patient educated on exam findings, POC, scope of PT, HEP    Person(s) Educated Patient    Methods Explanation;Handout    Comprehension Verbalized understanding              PT Short Term Goals - 12/29/20 1130       PT SHORT TERM GOAL #1   Title Patient will be independent with HEP in order to improve functional outcomes.    Time 3    Period Weeks    Status New    Target Date 01/19/21      PT SHORT TERM GOAL #2   Title Patient will report at least 25% improvement in symptoms for improved quality of life.    Time 3    Period Weeks    Status New    Target Date 01/19/21  PT Long Term Goals - 12/29/20 1131       PT LONG TERM GOAL #1   Title Patient will report at least 75% improvement in symptoms for improved quality of life.    Time 6    Period Weeks    Status New    Target Date 02/09/21       PT LONG TERM GOAL #2   Title Patient will be able to complete 5x STS in under 15 seconds in order to reduce the risk of falls.    Time 6    Period Weeks    Status New    Target Date 02/09/21      PT LONG TERM GOAL #3   Title Patient will be able to ambulate at least 375 feet in 2MWT in order to demonstrate improved gait speed for community ambulation.    Time 6    Period Weeks    Status New    Target Date 02/09/21      PT LONG TERM GOAL #4   Title Patient will report improved ability to lay in bed at night for improved quality of sleep.    Time 6    Period Weeks    Status New    Target Date 02/09/21                    Plan - 12/29/20 1126     Clinical Impression Statement Patient is a 80 y.o. female who presents to physical therapy with c/o low back pain with bilateral sciatica symptoms. She presents with pain limited deficits in lumbar and LE strength, ROM, endurance, postural impairments, gait, balance and functional mobility with ADL. She is having to modify and restrict ADL as indicated by subjective information and objective measures which is affecting overall participation. Patient will benefit from skilled physical therapy in order to improve function and reduce impairment.    Personal Factors and Comorbidities Age;Fitness;Time since onset of injury/illness/exacerbation    Examination-Activity Limitations Bed Mobility;Sleep;Sit;Stand;Stairs;Lift;Locomotion Level    Examination-Participation Restrictions Meal Prep;Yard Work;Volunteer;Shop;Cleaning    Stability/Clinical Decision Making Stable/Uncomplicated    Clinical Decision Making Low    Rehab Potential Good    PT Frequency 2x / week    PT Duration 6 weeks    PT Treatment/Interventions ADLs/Self Care Home Management;Aquatic Therapy;Cryotherapy;Electrical Stimulation;Iontophoresis '4mg'$ /ml Dexamethasone;Moist Heat;Traction;Ultrasound;DME Instruction;Gait training;Stair training;Functional mobility  training;Therapeutic activities;Therapeutic exercise;Balance training;Neuromuscular re-education;Patient/family education;Orthotic Fit/Training;Manual techniques;Scar mobilization;Passive range of motion;Manual lymph drainage;Compression bandaging;Dry needling;Energy conservation;Splinting;Taping;Spinal Manipulations;Joint Manipulations    PT Next Visit Plan f//u with [facility], begin lumbar and hip mobility exercises, core and hip strengthening, balance and functional strength training    PT Home Exercise Plan 8/18 [place] and Agree with Plan of Care Patient             Patient will benefit from skilled therapeutic intervention in order to improve the following deficits and impairments:  Decreased endurance, Increased muscle spasms, Decreased activity tolerance, Pain, Decreased balance, Impaired flexibility, Improper body mechanics, Decreased mobility, Decreased strength, Postural dysfunction  Visit Diagnosis: Low back pain, unspecified back pain laterality, unspecified chronicity, unspecified whether sciatica present  Muscle weakness (generalized)  Other abnormalities of gait and mobility  Other symptoms and signs involving the musculoskeletal system     Problem List Patient Active Problem List   Diagnosis Date Noted   Family history of colon cancer 04/27/2016   Heme + stool 04/27/2016    11:33 AM, 12/29/20 Mearl Latin PT, DPT Physical Therapist  at [facility] [place], [place], [address] Phone: [phone]   Fax:  [phone]  Name: Phyllis Santos MRN: [id number] Date of Birth: May 04, 1941

## 2021-01-03 ENCOUNTER — Other Ambulatory Visit: Payer: Self-pay

## 2021-01-03 ENCOUNTER — Ambulatory Visit (HOSPITAL_COMMUNITY): Payer: Medicare Other | Admitting: Physical Therapy

## 2021-01-03 ENCOUNTER — Encounter (HOSPITAL_COMMUNITY): Payer: Self-pay | Admitting: Physical Therapy

## 2021-01-03 DIAGNOSIS — M545 Low back pain, unspecified: Secondary | ICD-10-CM | POA: Diagnosis not present

## 2021-01-03 DIAGNOSIS — R2689 Other abnormalities of gait and mobility: Secondary | ICD-10-CM | POA: Diagnosis not present

## 2021-01-03 DIAGNOSIS — M6281 Muscle weakness (generalized): Secondary | ICD-10-CM | POA: Diagnosis not present

## 2021-01-03 DIAGNOSIS — R29898 Other symptoms and signs involving the musculoskeletal system: Secondary | ICD-10-CM

## 2021-01-03 NOTE — Therapy (Signed)
[place], [place], [address] Phone: [phone]   Fax:  [phone]  Physical Therapy Treatment  Patient Details  Name: Phyllis Santos MRN: [id number] Date of Birth: 03-Dec-1940 Referring Provider (PT): Allyn Kenner MD   Encounter Date: 01/03/2021   PT End of Session - 01/03/21 1125     Visit Number 2    Number of Visits 12    Date for PT Re-Evaluation 02/09/21    Authorization Type UHC Medicare (no auth, no visit limit)    Progress Note Due on Visit 10    PT Start Time 1125    PT Stop Time 1205    PT Time Calculation (min) 40 min    Activity Tolerance Patient tolerated treatment well    Behavior During Therapy [facility] for tasks assessed/performed             Past Medical History:  Diagnosis Date   Hypercholesterolemia    Hypertension     Past Surgical History:  Procedure Laterality Date   APPENDECTOMY     Dr Wonda Olds   CARPAL TUNNEL RELEASE     right-Dr Luna Glasgow   CATARACT EXTRACTION [facility]  08/16/2011   Procedure: CATARACT EXTRACTION PHACO AND INTRAOCULAR LENS PLACEMENT ([place]);  Surgeon: Tonny Branch, MD;  Location: [facility] OR[facility];  Service: Ophthalmology;  Laterality: Left;  CDE: 12.45   CATARACT EXTRACTION W/PHACO  09/03/2011   Procedure: CATARACT EXTRACTION PHACO AND INTRAOCULAR LENS PLACEMENT ([facility]);  Surgeon: Tonny Branch, MD;  Location: [facility];  Service: Ophthalmology;  Laterality: Right;  CDE:13.47   CHOLECYSTECTOMY     [facility]   COLONOSCOPY N/A 05/18/2016   Procedure: COLONOSCOPY;  Surgeon: Daneil Dolin, MD;  Location: [facility] ENDO SUITE;  Service: Endoscopy;  Laterality: N/A;  2:45pm   DILATION AND CURETTAGE OF UTERUS     Dr Elonda Husky 3-4 yrs ago   TUBAL LIGATION     Dr Wonda Olds    There were no vitals filed for this visit.   Subjective Assessment - 01/03/21 1125     Subjective PT states that she can tell a big difference already from the exercise that she was given    Limitations Walking;Other  (comment);House hold activities   sleeping, lying, walking hills   Patient Stated Goals figure out what's wrong with her legs    Currently in Pain? Yes    Pain Score 2     Pain Location Buttocks    Pain Orientation Right    Pain Descriptors / Indicators Aching    Pain Type Chronic pain    Pain Onset More than a month ago    Pain Frequency Constant    Aggravating Factors  WB    Pain Relieving Factors exercise                               [facility] Adult PT Treatment/Exercise - 01/03/21 0001       Exercises   Exercises Lumbar      Lumbar Exercises: Stretches   Single Knee to Chest Stretch 5 reps;10 seconds;Right;Left    Lower Trunk Rotation 5 reps;10 seconds      Lumbar Exercises: Supine   Ab Set 10 reps    Clam 5 reps    Bent Knee Raise 5 reps    Bridge 10 reps  PT Short Term Goals - 01/03/21 1130       PT SHORT TERM GOAL #1   Title Patient will be independent with HEP in order to improve functional outcomes.    Time 3    Period Weeks    Status On-going    Target Date 01/19/21      PT SHORT TERM GOAL #2   Title Patient will report at least 25% improvement in symptoms for improved quality of life.    Time 3    Period Weeks    Status On-going    Target Date 01/19/21               PT Long Term Goals - 01/03/21 1131       PT LONG TERM GOAL #1   Title Patient will report at least 75% improvement in symptoms for improved quality of life.    Time 6    Period Weeks    Status On-going      PT LONG TERM GOAL #2   Title Patient will be able to complete 5x STS in under 15 seconds in order to reduce the risk of falls.    Time 6    Period Weeks    Status On-going      PT LONG TERM GOAL #3   Title Patient will be able to ambulate at least 375 feet in 2MWT in order to demonstrate improved gait speed for community ambulation.    Time 6    Period Weeks    Status On-going      PT LONG TERM GOAL #4   Title  Patient will report improved ability to lay in bed at night for improved quality of sleep.    Time 6    Period Weeks    Status On-going                   Plan - 01/03/21 1125     Clinical Impression Statement Evaluation and goals reviewed with pt.  Therapist instructed pt in beginner stabilization program. Pt is very weak and had difficulty but was able to complete with proper technique after verbal and tactile cuing.    Personal Factors and Comorbidities Age;Fitness;Time since onset of injury/illness/exacerbation    Examination-Activity Limitations Bed Mobility;Sleep;Sit;Stand;Stairs;Lift;Locomotion Level    Examination-Participation Restrictions Meal Prep;Yard Work;Volunteer;Shop;Cleaning    Stability/Clinical Decision Making Stable/Uncomplicated    Rehab Potential Good    PT Frequency 2x / week    PT Duration 6 weeks    PT Treatment/Interventions ADLs/Self Care Home Management;Aquatic Therapy;Cryotherapy;Electrical Stimulation;Iontophoresis '4mg'$ /ml Dexamethasone;Moist Heat;Traction;Ultrasound;DME Instruction;Gait training;Stair training;Functional mobility training;Therapeutic activities;Therapeutic exercise;Balance training;Neuromuscular re-education;Patient/family education;Orthotic Fit/Training;Manual techniques;Scar mobilization;Passive range of motion;Manual lymph drainage;Compression bandaging;Dry needling;Energy conservation;Splinting;Taping;Spinal Manipulations;Joint Manipulations    PT Next Visit Plan continue  lumbar and hip mobility exercises, core and hip strengthening, balance and functional strength training    PT Home Exercise Plan 8/18 [facility], 8/23: ab set, bent knee raise, supine clam and bridge.    Consulted and Agree with Plan of Care Patient             Patient will benefit from skilled therapeutic intervention in order to improve the following deficits and impairments:  Decreased endurance, Increased muscle spasms, Decreased activity tolerance, Pain,  Decreased balance, Impaired flexibility, Improper body mechanics, Decreased mobility, Decreased strength, Postural dysfunction  Visit Diagnosis: Low back pain, unspecified back pain laterality, unspecified chronicity, unspecified whether sciatica present  Muscle weakness (generalized)  Other abnormalities of gait and mobility  Other symptoms  and signs involving the musculoskeletal system     Problem List Patient Active Problem List   Diagnosis Date Noted   Family history of colon cancer 04/27/2016   Heme + stool 04/27/2016   Rayetta Humphrey, PT CLT [phone]  01/03/2021, 12:10 PM  [place] [address] [place], [place], [address] Phone: [phone]   Fax:  [phone]  Name: Phyllis Santos MRN: [id number] Date of Birth: [birth year]

## 2021-01-05 ENCOUNTER — Other Ambulatory Visit: Payer: Self-pay

## 2021-01-05 ENCOUNTER — Ambulatory Visit (HOSPITAL_COMMUNITY): Payer: Medicare Other | Admitting: Physical Therapy

## 2021-01-05 DIAGNOSIS — M6281 Muscle weakness (generalized): Secondary | ICD-10-CM

## 2021-01-05 DIAGNOSIS — R2689 Other abnormalities of gait and mobility: Secondary | ICD-10-CM | POA: Diagnosis not present

## 2021-01-05 DIAGNOSIS — M545 Low back pain, unspecified: Secondary | ICD-10-CM | POA: Diagnosis not present

## 2021-01-05 DIAGNOSIS — R29898 Other symptoms and signs involving the musculoskeletal system: Secondary | ICD-10-CM | POA: Diagnosis not present

## 2021-01-05 NOTE — Therapy (Signed)
[place], [place], [address] Phone: [phone]   Fax:  [phone]  Physical Therapy Treatment  Patient Details  Name: AMARELY WHITCOMB MRN: [id number] Date of Birth: 1940/10/15 Referring Provider (PT): Allyn Kenner MD   Encounter Date: 01/05/2021   PT End of Session - 01/05/21 0949     Visit Number 3    Number of Visits 12    Date for PT Re-Evaluation 02/09/21    Authorization Type UHC Medicare (no auth, no visit limit)    Progress Note Due on Visit 10    PT Start Time 0950    PT Stop Time 1030    PT Time Calculation (min) 40 min    Activity Tolerance Patient tolerated treatment well    Behavior During Therapy [facility] for tasks assessed/performed             Past Medical History:  Diagnosis Date   Hypercholesterolemia    Hypertension     Past Surgical History:  Procedure Laterality Date   APPENDECTOMY     Dr Wonda Olds   CARPAL TUNNEL RELEASE     right-Dr Luna Glasgow   CATARACT EXTRACTION [facility]  08/16/2011   Procedure: CATARACT EXTRACTION PHACO AND INTRAOCULAR LENS PLACEMENT ([place]);  Surgeon: Tonny Branch, MD;  Location: [facility];  Service: Ophthalmology;  Laterality: Left;  CDE: 12.45   CATARACT EXTRACTION W/PHACO  09/03/2011   Procedure: CATARACT EXTRACTION PHACO AND INTRAOCULAR LENS PLACEMENT ([facility]);  Surgeon: Tonny Branch, MD;  Location: [facility];  Service: Ophthalmology;  Laterality: Right;  CDE:13.47   CHOLECYSTECTOMY     [facility]   COLONOSCOPY N/A 05/18/2016   Procedure: COLONOSCOPY;  Surgeon: Daneil Dolin, MD;  Location: [facility];  Service: Endoscopy;  Laterality: N/A;  2:45pm   DILATION AND CURETTAGE OF UTERUS     Dr Elonda Husky 3-4 yrs ago   TUBAL LIGATION     Dr Wonda Olds    There were no vitals filed for this visit.   Subjective Assessment - 01/05/21 0952     Subjective Pt states that she is doing her exercises and is feeling better she is not having any pain.    Limitations Walking;Other  (comment);House hold activities   sleeping, lying, walking hills   Patient Stated Goals figure out what's wrong with her legs    Currently in Pain? No/denies    Pain Onset More than a month ago                               [facility] Adult PT Treatment/Exercise - 01/05/21 0001       Exercises   Exercises Lumbar      Lumbar Exercises: Stretches   Single Knee to Chest Stretch Left;Right;3 reps;30 seconds      Lumbar Exercises: Standing   Heel Raises 10 reps    Functional Squats 10 reps    Other Standing Lumbar Exercises 3 D hip excursions x 3      Lumbar Exercises: Supine   Ab Set 10 reps    Bent Knee Raise 10 reps    Bridge 10 reps      Lumbar Exercises: Sidelying   Clam 10 reps    Hip Abduction 10 reps                      PT Short Term Goals - 01/03/21 1130  PT SHORT TERM GOAL #1   Title Patient will be independent with HEP in order to improve functional outcomes.    Time 3    Period Weeks    Status On-going    Target Date 01/19/21      PT SHORT TERM GOAL #2   Title Patient will report at least 25% improvement in symptoms for improved quality of life.    Time 3    Period Weeks    Status On-going    Target Date 01/19/21               PT Long Term Goals - 01/03/21 1131       PT LONG TERM GOAL #1   Title Patient will report at least 75% improvement in symptoms for improved quality of life.    Time 6    Period Weeks    Status On-going      PT LONG TERM GOAL #2   Title Patient will be able to complete 5x STS in under 15 seconds in order to reduce the risk of falls.    Time 6    Period Weeks    Status On-going      PT LONG TERM GOAL #3   Title Patient will be able to ambulate at least 375 feet in 2MWT in order to demonstrate improved gait speed for community ambulation.    Time 6    Period Weeks    Status On-going      PT LONG TERM GOAL #4   Title Patient will report improved ability to lay in bed at night for  improved quality of sleep.    Time 6    Period Weeks    Status On-going                   Plan - 01/05/21 0950     Clinical Impression Statement PT continues to improve.  Added wt bearing exercises  with  cuing needed for proper technique.  Continued to progress HEP    Personal Factors and Comorbidities Age;Fitness;Time since onset of injury/illness/exacerbation    Examination-Activity Limitations Bed Mobility;Sleep;Sit;Stand;Stairs;Lift;Locomotion Level    Examination-Participation Restrictions Meal Prep;Yard Work;Volunteer;Shop;Cleaning    Stability/Clinical Decision Making Stable/Uncomplicated    Rehab Potential Good    PT Frequency 2x / week    PT Duration 6 weeks    PT Treatment/Interventions ADLs/Self Care Home Management;Aquatic Therapy;Cryotherapy;Electrical Stimulation;Iontophoresis '4mg'$ /ml Dexamethasone;Moist Heat;Traction;Ultrasound;DME Instruction;Gait training;Stair training;Functional mobility training;Therapeutic activities;Therapeutic exercise;Balance training;Neuromuscular re-education;Patient/family education;Orthotic Fit/Training;Manual techniques;Scar mobilization;Passive range of motion;Manual lymph drainage;Compression bandaging;Dry needling;Energy conservation;Splinting;Taping;Spinal Manipulations;Joint Manipulations    PT Next Visit Plan continue  lumbar and hip mobility exercises, core and hip strengthening, balance and functional strength training    PT Home Exercise Plan 8/18 [facility], 8/23: ab set, bent knee raise, supine clam and bridge.    Consulted and Agree with Plan of Care Patient             Patient will benefit from skilled therapeutic intervention in order to improve the following deficits and impairments:  Decreased endurance, Increased muscle spasms, Decreased activity tolerance, Pain, Decreased balance, Impaired flexibility, Improper body mechanics, Decreased mobility, Decreased strength, Postural dysfunction  Visit Diagnosis: Low back pain,  unspecified back pain laterality, unspecified chronicity, unspecified whether sciatica present  Muscle weakness (generalized)  Other abnormalities of gait and mobility     Problem List Patient Active Problem List   Diagnosis Date Noted   Family history of colon cancer 04/27/2016   Heme + stool  04/27/2016   Rayetta Humphrey, PT CLT [phone]  01/05/2021, 10:36 AM  [place] [address] [place], [place], [address] Phone: [phone]   Fax:  [phone]  Name: MAHASIN DOLNEY MRN: [id number] Date of Birth: 02/21/1941

## 2021-01-09 ENCOUNTER — Other Ambulatory Visit: Payer: Self-pay

## 2021-01-09 ENCOUNTER — Ambulatory Visit (HOSPITAL_COMMUNITY): Payer: Medicare Other | Admitting: Physical Therapy

## 2021-01-09 DIAGNOSIS — M545 Low back pain, unspecified: Secondary | ICD-10-CM | POA: Diagnosis not present

## 2021-01-09 DIAGNOSIS — R2689 Other abnormalities of gait and mobility: Secondary | ICD-10-CM

## 2021-01-09 DIAGNOSIS — R29898 Other symptoms and signs involving the musculoskeletal system: Secondary | ICD-10-CM | POA: Diagnosis not present

## 2021-01-09 DIAGNOSIS — M6281 Muscle weakness (generalized): Secondary | ICD-10-CM | POA: Diagnosis not present

## 2021-01-09 DIAGNOSIS — E1165 Type 2 diabetes mellitus with hyperglycemia: Secondary | ICD-10-CM | POA: Diagnosis not present

## 2021-01-09 DIAGNOSIS — I10 Essential (primary) hypertension: Secondary | ICD-10-CM | POA: Diagnosis not present

## 2021-01-09 NOTE — Therapy (Signed)
[place], [place], [address] Phone: [phone]   Fax:  [phone]  Physical Therapy Treatment  Patient Details  Name: Phyllis Santos MRN: [id number] Date of Birth: 1941-01-16 Referring Provider (PT): Allyn Kenner MD   Encounter Date: 01/09/2021   PT End of Session - 01/09/21 0850     Visit Number 4    Number of Visits 12    Date for PT Re-Evaluation 02/09/21    Authorization Type UHC Medicare (no auth, no visit limit)    Progress Note Due on Visit 10    PT Start Time 0825    PT Stop Time 0905    PT Time Calculation (min) 40 min    Activity Tolerance Patient tolerated treatment well    Behavior During Therapy [facility] for tasks assessed/performed             Past Medical History:  Diagnosis Date   Hypercholesterolemia    Hypertension     Past Surgical History:  Procedure Laterality Date   APPENDECTOMY     Dr Wonda Olds   CARPAL TUNNEL RELEASE     right-Dr Luna Glasgow   CATARACT EXTRACTION [facility]  08/16/2011   Procedure: CATARACT EXTRACTION PHACO AND INTRAOCULAR LENS PLACEMENT ([place]);  Surgeon: Tonny Branch, MD;  Location: [facility];  Service: Ophthalmology;  Laterality: Left;  CDE: 12.45   CATARACT EXTRACTION W/PHACO  09/03/2011   Procedure: CATARACT EXTRACTION PHACO AND INTRAOCULAR LENS PLACEMENT ([facility]);  Surgeon: Tonny Branch, MD;  Location: [facility];  Service: Ophthalmology;  Laterality: Right;  CDE:13.47   CHOLECYSTECTOMY     [facility]   COLONOSCOPY N/A 05/18/2016   Procedure: COLONOSCOPY;  Surgeon: Daneil Dolin, MD;  Location: [facility];  Service: Endoscopy;  Laterality: N/A;  2:45pm   DILATION AND CURETTAGE OF UTERUS     Dr Elonda Husky 3-4 yrs ago   TUBAL LIGATION     Dr Wonda Olds    There were no vitals filed for this visit.   Subjective Assessment - 01/09/21 0846     Subjective Pt states that she had three deaths over the weekend, had to cook for the faimilies as they were all relatives and went to one  funeral.  She did not have time to do any exercise and she is in pain.    Limitations Walking;Other (comment);House hold activities   sleeping, lying, walking hills   Patient Stated Goals figure out what's wrong with her legs    Currently in Pain? Yes    Pain Score 3     Pain Location Leg    Pain Orientation Right    Pain Descriptors / Indicators Aching    Pain Type Chronic pain    Pain Onset More than a month ago    Pain Frequency Intermittent    Aggravating Factors  WB    Pain Relieving Factors exercises                               [facility] Adult PT Treatment/Exercise - 01/09/21 0001       Exercises   Exercises Lumbar      Lumbar Exercises: Stretches   Single Knee to Chest Stretch Left;Right;3 reps;30 seconds    Lower Trunk Rotation 5 reps;10 seconds      Lumbar Exercises: Sidelying   Clam 15 reps    Hip Abduction 15 reps      Lumbar Exercises: Prone  Other Prone Lumbar Exercises glut set with ab set; heel squeeze x 10 each      Manual Therapy   Manual Therapy Soft tissue mobilization    Manual therapy comments seperate from all other aspects of treatment.    Soft tissue mobilization to improve mm tension and pain.                      PT Short Term Goals - 01/03/21 1130       PT SHORT TERM GOAL #1   Title Patient will be independent with HEP in order to improve functional outcomes.    Time 3    Period Weeks    Status On-going    Target Date 01/19/21      PT SHORT TERM GOAL #2   Title Patient will report at least 25% improvement in symptoms for improved quality of life.    Time 3    Period Weeks    Status On-going    Target Date 01/19/21               PT Long Term Goals - 01/03/21 1131       PT LONG TERM GOAL #1   Title Patient will report at least 75% improvement in symptoms for improved quality of life.    Time 6    Period Weeks    Status On-going      PT LONG TERM GOAL #2   Title Patient will be able to  complete 5x STS in under 15 seconds in order to reduce the risk of falls.    Time 6    Period Weeks    Status On-going      PT LONG TERM GOAL #3   Title Patient will be able to ambulate at least 375 feet in 2MWT in order to demonstrate improved gait speed for community ambulation.    Time 6    Period Weeks    Status On-going      PT LONG TERM GOAL #4   Title Patient will report improved ability to lay in bed at night for improved quality of sleep.    Time 6    Period Weeks    Status On-going                   Plan - 01/09/21 [id number]     Clinical Impression Statement Began manual due to increased pain.  Therapist noted an area on Lt at approximate L4 level.  Pt states that she has had this for five years and it has not gotten any bigger.  Pt is going to her MD today, therapist requested PT to have MD look at the area all the same.  No addidion to exercises due to increased pain    Personal Factors and Comorbidities Age;Fitness;Time since onset of injury/illness/exacerbation    Examination-Activity Limitations Bed Mobility;Sleep;Sit;Stand;Stairs;Lift;Locomotion Level    Examination-Participation Restrictions Meal Prep;Yard Work;Volunteer;Shop;Cleaning    Stability/Clinical Decision Making Stable/Uncomplicated    Rehab Potential Good    PT Frequency 2x / week    PT Duration 6 weeks    PT Treatment/Interventions ADLs/Self Care Home Management;Aquatic Therapy;Cryotherapy;Electrical Stimulation;Iontophoresis '4mg'$ /ml Dexamethasone;Moist Heat;Traction;Ultrasound;DME Instruction;Gait training;Stair training;Functional mobility training;Therapeutic activities;Therapeutic exercise;Balance training;Neuromuscular re-education;Patient/family education;Orthotic Fit/Training;Manual techniques;Scar mobilization;Passive range of motion;Manual lymph drainage;Compression bandaging;Dry needling;Energy conservation;Splinting;Taping;Spinal Manipulations;Joint Manipulations    PT Next Visit Plan continue   lumbar and hip mobility exercises, core and hip strengthening, balance and functional strength training    PT Home Exercise  Plan 8/18 [place], 8/23: ab set, bent knee raise, supine clam and bridge.             Patient will benefit from skilled therapeutic intervention in order to improve the following deficits and impairments:  Decreased endurance, Increased muscle spasms, Decreased activity tolerance, Pain, Decreased balance, Impaired flexibility, Improper body mechanics, Decreased mobility, Decreased strength, Postural dysfunction  Visit Diagnosis: Low back pain, unspecified back pain laterality, unspecified chronicity, unspecified whether sciatica present  Muscle weakness (generalized)  Other abnormalities of gait and mobility  Other symptoms and signs involving the musculoskeletal system     Problem List Patient Active Problem List   Diagnosis Date Noted   Family history of colon cancer 04/27/2016   Heme + stool 04/27/2016   Rayetta Humphrey, PT CLT [phone]  01/09/2021, 9:09 AM  [place] [address] [place], [place], [address] Phone: [phone]   Fax:  [phone]  Name: Phyllis Santos MRN: [id number] Date of Birth: [birth year]

## 2021-01-11 ENCOUNTER — Ambulatory Visit (HOSPITAL_COMMUNITY): Payer: Medicare Other | Admitting: Physical Therapy

## 2021-01-11 ENCOUNTER — Other Ambulatory Visit: Payer: Self-pay

## 2021-01-11 ENCOUNTER — Encounter (HOSPITAL_COMMUNITY): Payer: Self-pay | Admitting: Physical Therapy

## 2021-01-11 DIAGNOSIS — M6281 Muscle weakness (generalized): Secondary | ICD-10-CM

## 2021-01-11 DIAGNOSIS — M545 Low back pain, unspecified: Secondary | ICD-10-CM

## 2021-01-11 DIAGNOSIS — R29898 Other symptoms and signs involving the musculoskeletal system: Secondary | ICD-10-CM | POA: Diagnosis not present

## 2021-01-11 DIAGNOSIS — R2689 Other abnormalities of gait and mobility: Secondary | ICD-10-CM | POA: Diagnosis not present

## 2021-01-11 NOTE — Therapy (Signed)
[place], [place], [address] Phone: [phone]   Fax:  [phone]  Physical Therapy Treatment  Patient Details  Name: Phyllis Santos MRN: [id number] Date of Birth: 10/03/1940 Referring Provider (PT): Allyn Kenner MD   Encounter Date: 01/11/2021   PT End of Session - 01/11/21 0823     Visit Number 5    Number of Visits 12    Date for PT Re-Evaluation 02/09/21    Authorization Type UHC Medicare (no auth, no visit limit)    Progress Note Due on Visit 10    PT Start Time 0818    PT Stop Time 0858    PT Time Calculation (min) 40 min    Activity Tolerance Patient tolerated treatment well    Behavior During Therapy [facility] for tasks assessed/performed             Past Medical History:  Diagnosis Date   Hypercholesterolemia    Hypertension     Past Surgical History:  Procedure Laterality Date   APPENDECTOMY     Dr Wonda Olds   CARPAL TUNNEL RELEASE     right-Dr Luna Glasgow   CATARACT EXTRACTION [facility]  08/16/2011   Procedure: CATARACT EXTRACTION PHACO AND INTRAOCULAR LENS PLACEMENT ([place]);  Surgeon: Tonny Branch, MD;  Location: [facility];  Service: Ophthalmology;  Laterality: Left;  CDE: 12.45   CATARACT EXTRACTION W/PHACO  09/03/2011   Procedure: CATARACT EXTRACTION PHACO AND INTRAOCULAR LENS PLACEMENT (IOC);  Surgeon: Tonny Branch, MD;  Location: [facility];  Service: Ophthalmology;  Laterality: Right;  CDE:13.47   CHOLECYSTECTOMY     [facility]   COLONOSCOPY N/A 05/18/2016   Procedure: COLONOSCOPY;  Surgeon: Daneil Dolin, MD;  Location: [facility] ENDO SU[facility];  Service: Endoscopy;  Laterality: N/A;  2:45pm   DILATION AND CURETTAGE OF UTERUS     Dr Elonda Husky 3-4 yrs ago   TUBAL LIGATION     Dr Wonda Olds    There were no vitals filed for this visit.   Subjective Assessment - 01/11/21 [id number]     Subjective Patient says she is doing well today. Has been doing exercises daily. Had some pain over the weekend in her leg, but exercises  helped.    Limitations Walking;Other (comment);House hold activities   sleeping, lying, walking hills   Patient Stated Goals figure out what's wrong with her legs    Currently in Pain? No/denies    Pain Onset More than a month ago                               [facility] Adult PT Treatment/Exercise - 01/11/21 0001       Lumbar Exercises: Stretches   Single Knee to Chest Stretch Left;Right;3 reps;30 seconds    Lower Trunk Rotation 5 reps;10 seconds      Lumbar Exercises: Supine   Bent Knee Raise 20 reps    Bridge 10 reps    Straight Leg Raise 10 reps    Straight Leg Raises Limitations with ab brace      Lumbar Exercises: Sidelying   Clam Both;10 reps    Hip Abduction Both;10 reps                      PT Short Term Goals - 01/03/21 1130       PT SHORT TERM GOAL #1   Title Patient will be independent with HEP in order to improve  functional outcomes.    Time 3    Period Weeks    Status On-going    Target Date 01/19/21      PT SHORT TERM GOAL #2   Title Patient will report at least 25% improvement in symptoms for improved quality of life.    Time 3    Period Weeks    Status On-going    Target Date 01/19/21               PT Long Term Goals - 01/03/21 1131       PT LONG TERM GOAL #1   Title Patient will report at least 75% improvement in symptoms for improved quality of life.    Time 6    Period Weeks    Status On-going      PT LONG TERM GOAL #2   Title Patient will be able to complete 5x STS in under 15 seconds in order to reduce the risk of falls.    Time 6    Period Weeks    Status On-going      PT LONG TERM GOAL #3   Title Patient will be able to ambulate at least 375 feet in 2MWT in order to demonstrate improved gait speed for community ambulation.    Time 6    Period Weeks    Status On-going      PT LONG TERM GOAL #4   Title Patient will report improved ability to lay in bed at night for improved quality of sleep.     Time 6    Period Weeks    Status On-going                   Plan - 01/11/21 0849     Clinical Impression Statement Patient tolerated session well today and was well challenged with core strength progressions. Added bent knee marching as well as straight leg raise with ab set. Patient cued on proper form and function, showed good return with cueing. Patient noting increased muscle fatigue, but no increased pain during session. Patient will continue to benefit from skilled therapy services to reduce deficits and improve functional ability. Patient does report having follow up appointment with MD next Wednesday to look at previously noted spot on her back.    Personal Factors and Comorbidities Age;Fitness;Time since onset of injury/illness/exacerbation    Examination-Activity Limitations Bed Mobility;Sleep;Sit;Stand;Stairs;Lift;Locomotion Level    Examination-Participation Restrictions Meal Prep;Yard Work;Volunteer;Shop;Cleaning    Stability/Clinical Decision Making Stable/Uncomplicated    Rehab Potential Good    PT Frequency 2x / week    PT Duration 6 weeks    PT Treatment/Interventions ADLs/Self Care Home Management;Aquatic Therapy;Cryotherapy;Electrical Stimulation;Iontophoresis '4mg'$ /ml Dexamethasone;Moist Heat;Traction;Ultrasound;DME Instruction;Gait training;Stair training;Functional mobility training;Therapeutic activities;Therapeutic exercise;Balance training;Neuromuscular re-education;Patient/family education;Orthotic Fit/Training;Manual techniques;Scar mobilization;Passive range of motion;Manual lymph drainage;Compression bandaging;Dry needling;Energy conservation;Splinting;Taping;Spinal Manipulations;Joint Manipulations    PT Next Visit Plan continue lumbar and hip mobility exercises, core and hip strengthening, balance and functional strength training. Progress to WB functional activity and static balance as tolerated next session    PT Home Exercise Plan 8/18 [facility], 8/23: ab set,  bent knee raise, supine clam and bridge 8/31 LTR, SLR    Consulted and Agree with Plan of Care Patient             Patient will benefit from skilled therapeutic intervention in order to improve the following deficits and impairments:  Decreased endurance, Increased muscle spasms, Decreased activity tolerance, Pain, Decreased balance, Impaired flexibility, Improper body mechanics, Decreased mobility,  Decreased strength, Postural dysfunction  Visit Diagnosis: Low back pain, unspecified back pain laterality, unspecified chronicity, unspecified whether sciatica present  Muscle weakness (generalized)  Other abnormalities of gait and mobility  Other symptoms and signs involving the musculoskeletal system     Problem List Patient Active Problem List   Diagnosis Date Noted   Family history of colon cancer 04/27/2016   Heme + stool 04/27/2016   8:55 AM, 01/11/21 Josue Hector PT DPT  Physical Therapist with [facility]  [phone] [place], [place], [address] Phone: [phone]   Fax:  [phone]  Name: LOURDES TEACHOUT MRN: [id number] Date of Birth: April 18, 1941

## 2021-01-11 NOTE — Patient Instructions (Signed)
Access Code: [email or web address] URL: [email or web address] Date: 01/11/2021 Prepared by: Josue Hector  Exercises Small Range Straight Leg Raise - 2 x daily - 7 x weekly - 1 sets - 15 reps Supine Lower Trunk Rotation - 2 x daily - 7 x weekly - 1 sets - 5 reps - 10 second hold

## 2021-01-17 ENCOUNTER — Other Ambulatory Visit: Payer: Self-pay

## 2021-01-17 ENCOUNTER — Ambulatory Visit (HOSPITAL_COMMUNITY): Payer: Medicare Other | Attending: Dermatology | Admitting: Physical Therapy

## 2021-01-17 ENCOUNTER — Encounter (HOSPITAL_COMMUNITY): Payer: Self-pay | Admitting: Physical Therapy

## 2021-01-17 DIAGNOSIS — R29898 Other symptoms and signs involving the musculoskeletal system: Secondary | ICD-10-CM | POA: Insufficient documentation

## 2021-01-17 DIAGNOSIS — M545 Low back pain, unspecified: Secondary | ICD-10-CM | POA: Insufficient documentation

## 2021-01-17 DIAGNOSIS — M6281 Muscle weakness (generalized): Secondary | ICD-10-CM | POA: Insufficient documentation

## 2021-01-17 DIAGNOSIS — R2689 Other abnormalities of gait and mobility: Secondary | ICD-10-CM | POA: Diagnosis not present

## 2021-01-17 NOTE — Therapy (Signed)
[place], [place], [address] Phone: [phone]   Fax:  [phone]  Physical Therapy Treatment  Patient Details  Name: Phyllis Santos MRN: [id number] Date of Birth: Sep 17, 1940 Referring Provider (PT): Allyn Kenner MD   Encounter Date: 01/17/2021   PT End of Session - 01/17/21 0829     Visit Number 6    Number of Visits 12    Date for PT Re-Evaluation 02/09/21    Authorization Type UHC Medicare (no auth, no visit limit)    Progress Note Due on Visit 10    PT Start Time 0821    PT Stop Time 0901    PT Time Calculation (min) 40 min    Activity Tolerance Patient tolerated treatment well    Behavior During Therapy [facility] for tasks assessed/performed             Past Medical History:  Diagnosis Date   Hypercholesterolemia    Hypertension     Past Surgical History:  Procedure Laterality Date   APPENDECTOMY     Dr Wonda Olds   CARPAL TUNNEL RELEASE     right-Dr Luna Glasgow   CATARACT EXTRACTION [facility]  08/16/2011   Procedure: CATARACT EXTRACTION PHACO AND INTRAOCULAR LENS PLACEMENT ([place]);  Surgeon: Tonny Branch, MD;  Location: [facility];  Service: Ophthalmology;  Laterality: Left;  CDE: 12.45   CATARACT EXTRACTION W/PHACO  09/03/2011   Procedure: CATARACT EXTRACTION PHACO AND INTRAOCULAR LENS PLACEMENT (IOC);  Surgeon: Tonny Branch, MD;  Location: [facility];  Service: Ophthalmology;  Laterality: Right;  CDE:13.47   CHOLECYSTECTOMY     [facility]   COLONOSCOPY N/A 05/18/2016   Procedure: COLONOSCOPY;  Surgeon: Daneil Dolin, MD;  Location: [facility];  Service: Endoscopy;  Laterality: N/A;  2:45pm   DILATION AND CURETTAGE OF UTERUS     Dr Elonda Husky 3-4 yrs ago   TUBAL LIGATION     Dr Wonda Olds    There were no vitals filed for this visit.   Subjective Assessment - 01/17/21 0829     Subjective Patient reports no new complaints. Says her back is doing much better. Feels her legs are stronger as well.    Limitations  Walking;Other (comment);House hold activities   sleeping, lying, walking hills   Patient Stated Goals figure out what's wrong with her legs    Currently in Pain? No/denies    Pain Onset More than a month ago                               [facility] Adult PT Treatment/Exercise - 01/17/21 0001       Lumbar Exercises: Stretches   Single Knee to Chest Stretch Left;Right;3 reps;30 seconds    Lower Trunk Rotation 5 reps;10 seconds      Lumbar Exercises: Standing   Heel Raises 15 reps    Other Standing Lumbar Exercises tandem stance 2 x 30"      Lumbar Exercises: Seated   Sit to Stand 10 reps      Lumbar Exercises: Supine   Bent Knee Raise 20 reps    Bridge 15 reps    Straight Leg Raise 10 reps    Straight Leg Raises Limitations with ab brace                      PT Short Term Goals - 01/03/21 1130       PT SHORT  TERM GOAL #1   Title Patient will be independent with HEP in order to improve functional outcomes.    Time 3    Period Weeks    Status On-going    Target Date 01/19/21      PT SHORT TERM GOAL #2   Title Patient will report at least 25% improvement in symptoms for improved quality of life.    Time 3    Period Weeks    Status On-going    Target Date 01/19/21               PT Long Term Goals - 01/03/21 1131       PT LONG TERM GOAL #1   Title Patient will report at least 75% improvement in symptoms for improved quality of life.    Time 6    Period Weeks    Status On-going      PT LONG TERM GOAL #2   Title Patient will be able to complete 5x STS in under 15 seconds in order to reduce the risk of falls.    Time 6    Period Weeks    Status On-going      PT LONG TERM GOAL #3   Title Patient will be able to ambulate at least 375 feet in 2MWT in order to demonstrate improved gait speed for community ambulation.    Time 6    Period Weeks    Status On-going      PT LONG TERM GOAL #4   Title Patient will report improved  ability to lay in bed at night for improved quality of sleep.    Time 6    Period Weeks    Status On-going                   Plan - 01/17/21 0857     Clinical Impression Statement Patient tolerated session well today with no increased complaint of pain. Progressed standing LE strength and static balance. Patient cued on proper form and mechanics with sit to stands. Well challenged with added tandem stance for static balance. Showing improved activity tolerance and core control overall. Patient will continue to benefit from skilled therapy services to reduce deficits and improve functional ability.    Personal Factors and Comorbidities Age;Fitness;Time since onset of injury/illness/exacerbation    Examination-Activity Limitations Bed Mobility;Sleep;Sit;Stand;Stairs;Lift;Locomotion Level    Examination-Participation Restrictions Meal Prep;Yard Work;Volunteer;Shop;Cleaning    Stability/Clinical Decision Making Stable/Uncomplicated    Rehab Potential Good    PT Frequency 2x / week    PT Duration 6 weeks    PT Treatment/Interventions ADLs/Self Care Home Management;Aquatic Therapy;Cryotherapy;Electrical Stimulation;Iontophoresis '4mg'$ /ml Dexamethasone;Moist Heat;Traction;Ultrasound;DME Instruction;Gait training;Stair training;Functional mobility training;Therapeutic activities;Therapeutic exercise;Balance training;Neuromuscular re-education;Patient/family education;Orthotic Fit/Training;Manual techniques;Scar mobilization;Passive range of motion;Manual lymph drainage;Compression bandaging;Dry needling;Energy conservation;Splinting;Taping;Spinal Manipulations;Joint Manipulations    PT Next Visit Plan continue lumbar and hip mobility exercises, core and hip strengthening, balance and functional strength training. step ups and sidestepping next visit.    PT Home Exercise Plan 8/18 [facility], 8/23: ab set, bent knee raise, supine clam and bridge 8/31 LTR, SLR    Consulted and Agree with Plan of Care  Patient             Patient will benefit from skilled therapeutic intervention in order to improve the following deficits and impairments:  Decreased endurance, Increased muscle spasms, Decreased activity tolerance, Pain, Decreased balance, Impaired flexibility, Improper body mechanics, Decreased mobility, Decreased strength, Postural dysfunction  Visit Diagnosis: Low back pain, unspecified back pain  laterality, unspecified chronicity, unspecified whether sciatica present  Muscle weakness (generalized)  Other abnormalities of gait and mobility  Other symptoms and signs involving the musculoskeletal system     Problem List Patient Active Problem List   Diagnosis Date Noted   Family history of colon cancer 04/27/2016   Heme + stool 04/27/2016   9:04 AM, 01/17/21 Josue Hector PT DPT  Physical Therapist with [facility]  [phone] [place] [place], [place], [address] Phone: [phone]   Fax:  [phone]  Name: Phyllis Santos MRN: [id number] Date of Birth: July 05, 1940

## 2021-01-18 DIAGNOSIS — L723 Sebaceous cyst: Secondary | ICD-10-CM | POA: Diagnosis not present

## 2021-01-19 ENCOUNTER — Other Ambulatory Visit: Payer: Self-pay

## 2021-01-19 ENCOUNTER — Ambulatory Visit (HOSPITAL_COMMUNITY): Payer: Medicare Other | Admitting: Physical Therapy

## 2021-01-19 DIAGNOSIS — M545 Low back pain, unspecified: Secondary | ICD-10-CM

## 2021-01-19 DIAGNOSIS — R2689 Other abnormalities of gait and mobility: Secondary | ICD-10-CM

## 2021-01-19 DIAGNOSIS — R29898 Other symptoms and signs involving the musculoskeletal system: Secondary | ICD-10-CM

## 2021-01-19 DIAGNOSIS — M6281 Muscle weakness (generalized): Secondary | ICD-10-CM

## 2021-01-19 NOTE — Therapy (Signed)
[place], [place], [address] Phone: [phone]   Fax:  [phone]  Physical Therapy Treatment  Patient Details  Name: Phyllis Santos MRN: [id number] Date of Birth: November 10, 1940 Referring Provider (PT): Allyn Kenner MD   Encounter Date: 01/19/2021   PT End of Session - 01/19/21 0914     Visit Number 7    Number of Visits 12    Date for PT Re-Evaluation 02/09/21    Authorization Type UHC Medicare (no auth, no visit limit)    Progress Note Due on Visit 10    PT Start Time 0830    PT Stop Time 0915    PT Time Calculation (min) 45 min    Activity Tolerance Patient tolerated treatment well    Behavior During Therapy [facility] for tasks assessed/performed             Past Medical History:  Diagnosis Date   Hypercholesterolemia    Hypertension     Past Surgical History:  Procedure Laterality Date   APPENDECTOMY     Dr Wonda Olds   CARPAL TUNNEL RELEASE     right-Dr Luna Glasgow   CATARACT EXTRACTION [facility]  08/16/2011   Procedure: CATARACT EXTRACTION PHACO AND INTRAOCULAR LENS PLACEMENT ([place]);  Surgeon: Tonny Branch, MD;  Location: [facility];  Service: Ophthalmology;  Laterality: Left;  CDE: 12.45   CATARACT EXTRACTION W/PHACO  09/03/2011   Procedure: CATARACT EXTRACTION PHACO AND INTRAOCULAR LENS PLACEMENT (IOC);  Surgeon: Tonny Branch, MD;  Location: [facility];  Service: Ophthalmology;  Laterality: Right;  CDE:13.47   CHOLECYSTECTOMY     [facility]   COLONOSCOPY N/A 05/18/2016   Procedure: COLONOSCOPY;  Surgeon: Daneil Dolin, MD;  Location: [facility];  Service: Endoscopy;  Laterality: N/A;  2:45pm   DILATION AND CURETTAGE OF UTERUS     Dr Elonda Husky 3-4 yrs ago   TUBAL LIGATION     Dr Wonda Olds    There were no vitals filed for this visit.   Subjective Assessment - 01/19/21 0839     Subjective pt states she is doing well today.  Not hurting or having any issues.  Feels like she's much stronger.    Currently in Pain?  No/denies                               [facility] Adult PT Treatment/Exercise - 01/19/21 0001       Lumbar Exercises: Standing   Heel Raises 20 reps    Other Standing Lumbar Exercises tandem stance 2 x 30", vectors 5X5" each with 1 HHA    Other Standing Lumbar Exercises forward and lateral step ups 4" step 2X10 each with 1 HHA, sidestepping on blue line 4RT      Lumbar Exercises: Seated   Sit to Stand 10 reps      Lumbar Exercises: Supine   Bent Knee Raise 20 reps    Bridge 15 reps    Straight Leg Raise 10 reps                       PT Short Term Goals - 01/03/21 1130       PT SHORT TERM GOAL #1   Title Patient will be independent with HEP in order to improve functional outcomes.    Time 3    Period Weeks    Status On-going    Target Date 01/19/21  PT SHORT TERM GOAL #2   Title Patient will report at least 25% improvement in symptoms for improved quality of life.    Time 3    Period Weeks    Status On-going    Target Date 01/19/21               PT Long Term Goals - 01/03/21 1131       PT LONG TERM GOAL #1   Title Patient will report at least 75% improvement in symptoms for improved quality of life.    Time 6    Period Weeks    Status On-going      PT LONG TERM GOAL #2   Title Patient will be able to complete 5x STS in under 15 seconds in order to reduce the risk of falls.    Time 6    Period Weeks    Status On-going      PT LONG TERM GOAL #3   Title Patient will be able to ambulate at least 375 feet in 2MWT in order to demonstrate improved gait speed for community ambulation.    Time 6    Period Weeks    Status On-going      PT LONG TERM GOAL #4   Title Patient will report improved ability to lay in bed at night for improved quality of sleep.    Time 6    Period Weeks    Status On-going                   Plan - 01/19/21 0935     Clinical Impression Statement Progressed standing/functional  strengthening this session.  Began forward and lateral step ups with cues for eccentric control and overall form.  Sidestepping began on line with cues to keep toes forward to utilize hip abductors.   Vectors added with noted challenge for stabilizers.  Pt has several MD appointments next week so cancelled her appointments for next week. Instructed to keep up with her HEP.    Personal Factors and Comorbidities Age;Fitness;Time since onset of injury/illness/exacerbation    Examination-Activity Limitations Bed Mobility;Sleep;Sit;Stand;Stairs;Lift;Locomotion Level    Examination-Participation Restrictions Meal Prep;Yard Work;Volunteer;Shop;Cleaning    Stability/Clinical Decision Making Stable/Uncomplicated    Rehab Potential Good    PT Frequency 2x / week    PT Duration 6 weeks    PT Treatment/Interventions ADLs/Self Care Home Management;Aquatic Therapy;Cryotherapy;Electrical Stimulation;Iontophoresis '4mg'$ /ml Dexamethasone;Moist Heat;Traction;Ultrasound;DME Instruction;Gait training;Stair training;Functional mobility training;Therapeutic activities;Therapeutic exercise;Balance training;Neuromuscular re-education;Patient/family education;Orthotic Fit/Training;Manual techniques;Scar mobilization;Passive range of motion;Manual lymph drainage;Compression bandaging;Dry needling;Energy conservation;Splinting;Taping;Spinal Manipulations;Joint Manipulations    PT Next Visit Plan continue lumbar and hip mobility exercises, core and hip strengthening, balance and functional strength training.  Resume therapy on 9/20.    PT Home Exercise Plan 8/18 [facility], 8/23: ab set, bent knee raise, supine clam and bridge 8/31 LTR, SLR    Consulted and Agree with Plan of Care Patient             Patient will benefit from skilled therapeutic intervention in order to improve the following deficits and impairments:  Decreased endurance, Increased muscle spasms, Decreased activity tolerance, Pain, Decreased balance, Impaired  flexibility, Improper body mechanics, Decreased mobility, Decreased strength, Postural dysfunction  Visit Diagnosis: Low back pain, unspecified back pain laterality, unspecified chronicity, unspecified whether sciatica present  Muscle weakness (generalized)  Other abnormalities of gait and mobility  Other symptoms and signs involving the musculoskeletal system     Problem List Patient Active Problem List   Diagnosis Date  Noted   Family history of colon cancer 04/27/2016   Heme + stool 04/27/2016   Teena Irani, PTA/CLT [phone]  Teena Irani, PTA 01/19/2021, 9:37 AM  [place] [address] [place], [place], [address] Phone: [phone]   Fax:  [phone]  Name: Phyllis Santos MRN: [id number] Date of Birth: 10-08-1940

## 2021-01-24 ENCOUNTER — Encounter (HOSPITAL_COMMUNITY): Payer: Medicare Other | Admitting: Physical Therapy

## 2021-01-26 ENCOUNTER — Encounter (HOSPITAL_COMMUNITY): Payer: Medicare Other | Admitting: Physical Therapy

## 2021-01-31 ENCOUNTER — Other Ambulatory Visit: Payer: Self-pay

## 2021-01-31 ENCOUNTER — Encounter (HOSPITAL_COMMUNITY): Payer: Self-pay | Admitting: Physical Therapy

## 2021-01-31 ENCOUNTER — Ambulatory Visit (HOSPITAL_COMMUNITY): Payer: Medicare Other | Admitting: Physical Therapy

## 2021-01-31 DIAGNOSIS — M545 Low back pain, unspecified: Secondary | ICD-10-CM

## 2021-01-31 DIAGNOSIS — R2689 Other abnormalities of gait and mobility: Secondary | ICD-10-CM

## 2021-01-31 DIAGNOSIS — R29898 Other symptoms and signs involving the musculoskeletal system: Secondary | ICD-10-CM | POA: Diagnosis not present

## 2021-01-31 DIAGNOSIS — M6281 Muscle weakness (generalized): Secondary | ICD-10-CM | POA: Diagnosis not present

## 2021-01-31 NOTE — Patient Instructions (Signed)
Access Code: [facility] URL: [email or web address] Date: 01/31/2021 Prepared by: [place] with Counter Support - 1 x daily - 7 x weekly - 1 sets - 20 reps Mini Squat with Counter Support - 1 x daily - 7 x weekly - 2 sets - 10 reps Step Up - 1 x daily - 7 x weekly - 2 sets - 10 reps Lateral Step Up - 1 x daily - 7 x weekly - 2 sets - 10 reps

## 2021-01-31 NOTE — Therapy (Signed)
[facility] [address] [place], [place], [address] Phone: [phone]   Fax:  [phone]  Physical Therapy Treatment/Discharge Summary  Patient Details  Name: Phyllis Santos MRN: [id number] Date of Birth: [birth year] Referring Provider (PT): Allyn Kenner MD   Encounter Date: 01/31/2021  PHYSICAL THERAPY DISCHARGE SUMMARY  Visits from Start of Care: 8  Current functional level related to goals / functional outcomes: See below   Remaining deficits: See below   Education / Equipment: See below   Patient agrees to discharge. Patient goals were met. Patient is being discharged due to being pleased with the current functional level.    PT End of Session - 01/31/21 0835     Visit Number 8    Number of Visits 12    Date for PT Re-Evaluation 02/09/21    Authorization Type [employer] Medicare (no auth, no visit limit)    Progress Note Due on Visit 10    PT Start Time [phone]    PT Stop Time 0901    PT Time Calculation (min) 26 min    Activity Tolerance Patient tolerated treatment well    Behavior During Therapy [facility] for tasks assessed/performed             Past Medical History:  Diagnosis Date   Hypercholesterolemia    Hypertension     Past Surgical History:  Procedure Laterality Date   APPENDECTOMY     Dr Wonda Olds   CARPAL TUNNEL RELEASE     right-Dr Luna Glasgow   CATARACT EXTRACTION [facility]  08/16/2011   Procedure: CATARACT EXTRACTION PHACO AND INTRAOCULAR LENS PLACEMENT ([place]);  Surgeon: Tonny Branch, MD;  Location: [facility];  Service: Ophthalmology;  Laterality: Left;  CDE: 12.45   CATARACT EXTRACTION W/PHACO  09/03/2011   Procedure: CATARACT EXTRACTION PHACO AND INTRAOCULAR LENS PLACEMENT ([facility]);  Surgeon: Tonny Branch, MD;  Location: [facility];  Service: Ophthalmology;  Laterality: Right;  CDE:13.47   CHOLECYSTECTOMY     [facility]   COLONOSCOPY N/A 05/18/2016   Procedure: COLONOSCOPY;  Surgeon: Daneil Dolin, MD;  Location: [facility];  Service: Endoscopy;  Laterality: N/A;  2:45pm   DILATION AND CURETTAGE OF UTERUS     Dr Elonda Husky 3-4 yrs ago   TUBAL LIGATION     Dr Wonda Olds    There were no vitals filed for this visit.   Subjective Assessment - 01/31/21 0836     Subjective Patient states her exercises are going well 2-3x/day. Patient states 100% improvement since beginning therapy. Patient state she is sleeping better. She is not having trouble with household things and is moving quicker and easier.    Currently in Pain? No/denies                [facility] PT Assessment - 01/31/21 0001       Assessment   Medical Diagnosis Lumbaro with sciatica bilateral    Referring Provider (PT) Allyn Kenner MD    Onset Date/Surgical Date 07/01/20    Next MD Visit Jan. 2023    Prior Therapy none      Precautions   Precautions None      Restrictions   Weight Bearing Restrictions No      Balance Screen   Has the patient fallen in the past 6 months No    Has the patient had a decrease in activity level because of a fear of falling?  No    Is the patient reluctant to leave their home because  of a fear of falling?  No      Prior Function   Level of Independence Independent      Cognition   Overall Cognitive Status Within Functional Limits for tasks assessed      Observation/Other Assessments   Observations Ambulates without AD      Sensation   Light Touch Appears Intact      AROM   Lumbar Flexion 0% limited    Lumbar Extension 0% limited    Lumbar - Right Side Bend 0% limited    Lumbar - Left Side Bend 0% limited    Lumbar - Right Rotation 0% limited    Lumbar - Left Rotation 0% limited      Strength   Right Hip Flexion 4+/5    Left Hip Flexion 4+/5    Right Knee Flexion 5/5    Right Knee Extension 5/5    Left Knee Flexion 5/5    Left Knee Extension 5/5    Right Ankle Dorsiflexion 5/5    Left Ankle Dorsiflexion 5/5      Transfers   Five time sit to stand comments  12.2 seconds without UE use       Ambulation/Gait   Ambulation/Gait Yes    Ambulation/Gait Assistance 7: Independent    Ambulation Distance (Feet) 425 Feet    Assistive device None    Ambulation Surface Level;Indoor    Gait Comments 2 MWT                                    PT Education - 01/31/21 0836     Education Details HEP, reassessment findings    Person(s) Educated Patient    Methods Explanation    Comprehension Verbalized understanding              PT Short Term Goals - 01/31/21 0839       PT SHORT TERM GOAL #1   Title Patient will be independent with HEP in order to improve functional outcomes.    Time 3    Period Weeks    Status Achieved    Target Date 01/19/21      PT SHORT TERM GOAL #2   Title Patient will report at least 25% improvement in symptoms for improved quality of life.    Time 3    Period Weeks    Status Achieved    Target Date 01/19/21               PT Long Term Goals - 01/31/21 0839       PT LONG TERM GOAL #1   Title Patient will report at least 75% improvement in symptoms for improved quality of life.    Time 6    Period Weeks    Status Achieved      PT LONG TERM GOAL #2   Title Patient will be able to complete 5x STS in under 15 seconds in order to reduce the risk of falls.    Time 6    Period Weeks    Status Achieved      PT LONG TERM GOAL #3   Title Patient will be able to ambulate at least 375 feet in 2MWT in order to demonstrate improved gait speed for community ambulation.    Time 6    Period Weeks    Status Achieved      PT LONG TERM GOAL #4  Title Patient will report improved ability to lay in bed at night for improved quality of sleep.    Time 6    Period Weeks    Status Achieved                   Plan - 01/31/21 0836     Clinical Impression Statement Patient has met all short and long term goals with ability to complete HEP and improvement in symptoms, strength, gait, balance, and functional mobility.  Patient without deficit at this time. Reviewed HEP with patient and she is eager to continue with HEP to improve function. Patient educated on returning if needed. Patient discharged from physical therapy at this time.    Personal Factors and Comorbidities Age;Fitness;Time since onset of injury/illness/exacerbation    Examination-Activity Limitations Bed Mobility;Sleep;Sit;Stand;Stairs;Lift;Locomotion Level    Examination-Participation Restrictions Meal Prep;Yard Work;Volunteer;Shop;Cleaning    Stability/Clinical Decision Making Stable/Uncomplicated    Rehab Potential Good    PT Frequency --    PT Duration --    PT Treatment/Interventions ADLs/Self Care Home Management;Aquatic Therapy;Cryotherapy;Electrical Stimulation;Iontophoresis 5m/ml Dexamethasone;Moist Heat;Traction;Ultrasound;DME Instruction;Gait training;Stair training;Functional mobility training;Therapeutic activities;Therapeutic exercise;Balance training;Neuromuscular re-education;Patient/family education;Orthotic Fit/Training;Manual techniques;Scar mobilization;Passive range of motion;Manual lymph drainage;Compression bandaging;Dry needling;Energy conservation;Splinting;Taping;Spinal Manipulations;Joint Manipulations    PT Next Visit Plan n/a    [facility] ab set, bent knee raise, supine clam and bridge 8/31 LTR, SLR 9/20 HR, mini squat, step up fwd and lateral    Consulted and Agree with Plan of Care Patient             Patient will benefit from skilled therapeutic intervention in order to improve the following deficits and impairments:  Decreased endurance, Increased muscle spasms, Decreased activity tolerance, Pain, Decreased balance, Impaired flexibility, Improper body mechanics, Decreased mobility, Decreased strength, Postural dysfunction  Visit Diagnosis: Low back pain, unspecified back pain laterality, unspecified chronicity, unspecified whether sciatica present  Muscle weakness  (generalized)  Other abnormalities of gait and mobility  Other symptoms and signs involving the musculoskeletal system     Problem List Patient Active Problem List   Diagnosis Date Noted   Family history of colon cancer 04/27/2016   Heme + stool 04/27/2016    9:05 AM, 01/31/21 AMearl LatinPT, DPT Physical Therap[place] [facility] [id number] [id number]: [phone]  Fax:  [phone] Name: AGEORGINA KRISTMRN: [id number] of Birth: [birth year]

## 2021-02-02 ENCOUNTER — Encounter (HOSPITAL_COMMUNITY): Payer: Medicare Other | Admitting: Physical Therapy

## 2021-02-07 ENCOUNTER — Ambulatory Visit: Payer: Medicare Other | Admitting: General Surgery

## 2021-02-07 ENCOUNTER — Encounter (HOSPITAL_COMMUNITY): Payer: Medicare Other | Admitting: Physical Therapy

## 2021-02-07 ENCOUNTER — Other Ambulatory Visit: Payer: Self-pay

## 2021-02-07 ENCOUNTER — Encounter: Payer: Self-pay | Admitting: General Surgery

## 2021-02-07 VITALS — BP 154/76 | HR 78 | Temp 98.6°F | Resp 16 | Ht 62.0 in | Wt 212.0 lb

## 2021-02-07 DIAGNOSIS — D2330 Other benign neoplasm of skin of unspecified part of face: Secondary | ICD-10-CM | POA: Insufficient documentation

## 2021-02-07 DIAGNOSIS — L723 Sebaceous cyst: Secondary | ICD-10-CM | POA: Diagnosis not present

## 2021-02-07 NOTE — Patient Instructions (Signed)
Epidermoid Cyst Removal Epidermoid cyst removal is a procedure to remove a fluid-filled sac that forms under your skin (epidermoid cyst). This type of cyst is filled with a thick, oily substance (keratin) that is secreted by your skin glands. Epidermoid cysts may also be called epidermal cysts, or keratin cysts. Normally, the skin secretes this pasty material through a gland or a hair follicle. However, when a skin gland or hair follicle becomes blocked, an epidermoid cyst can form. You may need this procedure if you have an epidermal cyst that becomes large, uncomfortable, or inflamed. Tell a health care provider about: Any allergies you have. All medicines you are taking, including vitamins, herbs, eye drops, creams, and over-the-counter medicines. Any problems you or family members have had with anesthetic medicines. Any blood disorders you have. Any surgeries you have had. Any medical conditions you have now or have had. Whether you are pregnant or may be pregnant. What are the risks? Generally, this is a safe procedure. However, problems may occur, including: Recurrence of the cyst. Bleeding. Infection. Scarring. What happens before the procedure? Ask your health care provider about: Changing or stopping your regular medicines. This is especially important if you are taking diabetes medicines or blood thinners. Taking medicines such as aspirin and ibuprofen. These medicines can thin your blood. Do not take these medicines unless your health care provider tells you to take them. Taking over-the-counter medicines, vitamins, herbs, and supplements. If you have an inflamed or infected cyst, you may have to take antibiotic medicine before the cyst removal. Take your antibiotic as told by your health care provider. Do not stop taking the antibiotic even if you start to feel better. Take a shower on the morning of your procedure. Your health care provider may ask you to use a germ-killing  soap. What happens during the procedure?  You will be given a medicine to numb the area (local anesthetic). The skin around the cyst will be cleaned with a germ-killing solution. The health care provider will make a small incision in your skin over the cyst. The health care provider will separate the cyst from the surrounding tissues that are under your skin. If possible, the cyst will be removed undamaged (intact). If the cyst bursts (ruptures), it will be removed in pieces. After the cyst is removed, the health care provider will control any bleeding and close the incision with small stitches (sutures). Small incisions may not need sutures, and the bleeding will be controlled by applying direct pressure with gauze. The health care provider may apply antibiotic ointment and a bandage (dressing) over the incision. The procedure may vary among health care providers and hospitals. What happens after the procedure? If you are prescribed an antibiotic medicine or ointment, take or apply it as told by your health care provider. Do not stop using the antibiotic even if you start to feel better. Summary Epidermoid cyst removal is a procedure to remove a sac that has formed under your skin. You may need this procedure if you have an epidermoid cyst that becomes large, uncomfortable, or inflamed. The health care provider will make a small incision in your skin to remove the cyst. If you are prescribed an antibiotic medicine before the procedure, after the procedure, or both, use the antibiotic as told by your health care provider. Do not stop using the antibiotic even if you start to feel better. This information is not intended to replace advice given to you by your health care provider. Make sure  you discuss any questions you have with your health care provider. Document Revised: 08/05/2019 Document Reviewed: 08/05/2019 Elsevier Patient Education  [place].

## 2021-02-07 NOTE — Progress Notes (Signed)
[facility] History and Physical  Reason for Referral: Cyst on chin and back  Referring Physician: Celene Squibb, MD    Chief Complaint   New Patient (Initial Visit)     Phyllis Santos is a 80 y.o. female.  HPI: Phyllis Santos is a 80 yo with a history of a cyst on her right chin / jaw line and her back for several years. She denies them ever getting inflamed or infected. She says that they do bother her at times and she worries about infection.  They have grown in the time she has had them. She was referred by her PCP for excision.  She is otherwise in good health for her age.   Past Medical History:  Diagnosis Date   Hypercholesterolemia    Hypertension     Past Surgical History:  Procedure Laterality Date   APPENDECTOMY     Dr Wonda Olds   CARPAL TUNNEL RELEASE     right-Dr Luna Glasgow   CATARACT EXTRACTION [facility]  08/16/2011   Procedure: CATARACT EXTRACTION PHACO AND INTRAOCULAR LENS PLACEMENT ([place]);  Surgeon: Tonny Branch, MD;  Location: [facility];  Service: Ophthalmology;  Laterality: Left;  CDE: 12.45   CATARACT EXTRACTION W/PHACO  09/03/2011   Procedure: CATARACT EXTRACTION PHACO AND INTRAOCULAR LENS PLACEMENT ([facility]);  Surgeon: Tonny Branch, MD;  Location: [facility];  Service: Ophthalmology;  Laterality: Right;  CDE:13.47   CHOLECYSTECTOMY     [facility]   COLONOSCOPY N/A 05/18/2016   Procedure: COLONOSCOPY;  Surgeon: Daneil Dolin, MD;  Location[facility];  Service: Endoscopy;  Laterality: N/A;  2:45pm   DILATION AND CURETTAGE OF UTERUS     Dr Elonda Husky 3-4 yrs ago   TUBAL LIGATION     Dr Wonda Olds    Family History  Problem Relation Age of Onset   Colon cancer Brother 63       Still alive, in his eighties   Anesthesia problems Neg Hx    Hypotension Neg Hx    Malignant hyperthermia Neg Hx    Pseudochol deficiency Neg Hx     Social History   Tobacco Use   Smoking status: Former    Packs/day: 0.50    Years: 10.00    Pack years: 5.00    Types: Cigarettes     Quit date: 08/07/1994    Years since quitting: 26.5   Smokeless tobacco: Never  Substance Use Topics   Alcohol use: No   Drug use: No    Medications: I have reviewed the patient's current medications. Allergies as of 02/07/2021       Reactions   Penicillins Anaphylaxis, Rash   Rash around neck Has patient had a PCN reaction causing immediate rash, facial/tongue/throat swelling, SOB or lightheadedness with hypotension: {yes Has patient had a PCN reaction causing severe rash involving mucus membranes or skin necrosis: {yes Has patient had a PCN reaction that required hospitalization {no Has patient had a PCN reaction occurring within the last 10 years: {no If all of the above answers are "NO", then may proceed with Cephalosporin use.   Codeine Other (See Comments)   Spasms in stomach   Levaquin [levofloxacin In D5w] Other (See Comments)   Numbness in arms        Medication List        Accurate as of February 07, 2021 11:55 AM. If you have any questions, ask your nurse or doctor.          aspirin EC 81 MG tablet  Take 81 mg by mouth daily.   atorvastatin 10 MG tablet Commonly known as: LIPITOR Take 10 mg by mouth daily.   Calcium Carbonate-Vitamin D 500-125 MG-UNIT Tabs Take 1 tablet by mouth daily.   cholecalciferol 1000 units tablet Commonly known as: VITAMIN D Take 1,000 Units by mouth daily.   hydrochlorothiazide 25 MG tablet Commonly known as: HYDRODIURIL Take 25 mg by mouth daily.   losartan 50 MG tablet Commonly known as: COZAAR Take 50 mg by mouth daily.         ROS:  A comprehensive review of systems was negative except for: Musculoskeletal: positive for bone pain and joint pain  Blood pressure (!) 154/76, pulse 78, temperature 98.6 F (37 C), temperature source Other (Comment), resp. rate 16, height 5\' 2"  (1.575 m), weight 212 lb (96.2 kg), SpO2 94 %. Physical Exam Vitals reviewed.  Constitutional:      Appearance: Normal appearance.   HENT:     Head: Normocephalic.     Comments: Right jaw line about midway 2cm cyst with central pit, no erythema or drainage    Nose: Nose normal.     Mouth/Throat:     Mouth: Mucous membranes are moist.  Eyes:     Extraocular Movements: Extraocular movements intact.  Cardiovascular:     Rate and Rhythm: Normal rate and regular rhythm.  Pulmonary:     Effort: Pulmonary effort is normal.     Breath sounds: Normal breath sounds.  Abdominal:     General: There is no distension.     Palpations: Abdomen is soft.     Tenderness: There is no abdominal tenderness.  Musculoskeletal:        General: Swelling present.     Comments: Lower mid back, central 2cm cyst with central pit that is black, no other erythema or drainage  Skin:    General: Skin is warm.  Neurological:     General: No focal deficit present.     Mental Status: She is alert and oriented to person, place, and time.  Psychiatric:        Mood and Affect: Mood normal.        Behavior: Behavior normal.        Thought Content: Thought content normal.    Results: None   Assessment & Plan:  Phyllis Santos is a 80 y.o. 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 She wants to have these excised. Discussed risk of bleeding, infection, recurrence, nerve injury on the face, and risk of having to do wound care.   -Plan for excision with anesthesia given the locations and size    All questions were answered to the satisfaction of the patient.     Virl Cagey 02/07/2021, 11:55 AM

## 2021-02-08 NOTE — H&P (Signed)
[facility] History and Physical  Reason for Referral: Cyst on chin and back  Referring Physician: Celene Squibb, MD    Chief Complaint   New Patient (Initial Visit)     Phyllis Santos is a 80 y.o. female.  HPI: Phyllis Santos is a 81 yo with a history of a cyst on her right chin / jaw line and her back for several years. She denies them ever getting inflamed or infected. She says that they do bother her at times and she worries about infection.  They have grown in the time she has had them. She was referred by her PCP for excision.  She is otherwise in good health for her age.   Past Medical History:  Diagnosis Date   Hypercholesterolemia    Hypertension     Past Surgical History:  Procedure Laterality Date   APPENDECTOMY     Dr Wonda Olds   CARPAL TUNNEL RELEASE     right-Dr Luna Glasgow   CATARACT EXTRACTION [facility]  08/16/2011   Procedure: CATARACT EXTRACTION PHACO AND INTRAOCULAR LENS PLACEMENT ([place]);  Surgeon: Tonny Branch, MD;  Location: [facility];  Service: Ophthalmology;  Laterality: Left;  CDE: 12.45   CATARACT EXTRACTION W/PHACO  09/03/2011   Procedure: CATARACT EXTRACTION PHACO AND INTRAOCULAR LENS PLACEMENT ([facility]);  Surgeon: Tonny Branch, MD;  Location: [facility];  Service: Ophthalmology;  Laterality: Right;  CDE:13.47   CHOLECYSTECTOMY     [facility]   COLONOSCOPY N/A 05/18/2016   Procedure: COLONOSCOPY;  Surgeon: Daneil Dolin, MD;  [facility];  Service: Endoscopy;  Laterality: N/A;  2:45pm   DILATION AND CURETTAGE OF UTERUS     Dr Elonda Husky 3-4 yrs ago   TUBAL LIGATION     Dr Wonda Olds    Family History  Problem Relation Age of Onset   Colon cancer Brother 23       Still alive, in his eighties   Anesthesia problems Neg Hx    Hypotension Neg Hx    Malignant hyperthermia Neg Hx    Pseudochol deficiency Neg Hx     Social History   Tobacco Use   Smoking status: Former    Packs/day: 0.50    Years: 10.00    Pack years: 5.00    Types: Cigarettes     Quit date: 08/07/1994    Years since quitting: 26.5   Smokeless tobacco: Never  Substance Use Topics   Alcohol use: No   Drug use: No    Medications: I have reviewed the patient's current medications. Allergies as of 02/07/2021       Reactions   Penicillins Anaphylaxis, Rash   Rash around neck Has patient had a PCN reaction causing immediate rash, facial/tongue/throat swelling, SOB or lightheadedness with hypotension: {yes Has patient had a PCN reaction causing severe rash involving mucus membranes or skin necrosis: {yes Has patient had a PCN reaction that required hospitalization {no Has patient had a PCN reaction occurring within the last 10 years: {no If all of the above answers are "NO", then may proceed with Cephalosporin use.   Codeine Other (See Comments)   Spasms in stomach   Levaquin [levofloxacin In D5w] Other (See Comments)   Numbness in arms        Medication List        Accurate as of February 07, 2021 11:55 AM. If you have any questions, ask your nurse or doctor.          aspirin EC 81 MG tablet  Take 81 mg by mouth daily.   atorvastatin 10 MG tablet Commonly known as: LIPITOR Take 10 mg by mouth daily.   Calcium Carbonate-Vitamin D 500-125 MG-UNIT Tabs Take 1 tablet by mouth daily.   cholecalciferol 1000 units tablet Commonly known as: VITAMIN D Take 1,000 Units by mouth daily.   hydrochlorothiazide 25 MG tablet Commonly known as: HYDRODIURIL Take 25 mg by mouth daily.   losartan 50 MG tablet Commonly known as: COZAAR Take 50 mg by mouth daily.         ROS:  A comprehensive review of systems was negative except for: Musculoskeletal: positive for bone pain and joint pain  Blood pressure (!) 154/76, pulse 78, temperature 98.6 F (37 C), temperature source Other (Comment), resp. rate 16, height 5\' 2"  (1.575 m), weight 212 lb (96.2 kg), SpO2 94 %. Physical Exam Vitals reviewed.  Constitutional:      Appearance: Normal appearance.   HENT:     Head: Normocephalic.     Comments: Right jaw line about midway 2cm cyst with central pit, no erythema or drainage    Nose: Nose normal.     Mouth/Throat:     Mouth: Mucous membranes are moist.  Eyes:     Extraocular Movements: Extraocular movements intact.  Cardiovascular:     Rate and Rhythm: Normal rate and regular rhythm.  Pulmonary:     Effort: Pulmonary effort is normal.     Breath sounds: Normal breath sounds.  Abdominal:     General: There is no distension.     Palpations: Abdomen is soft.     Tenderness: There is no abdominal tenderness.  Musculoskeletal:        General: Swelling present.     Comments: Lower mid back, central 2cm cyst with central pit that is black, no other erythema or drainage  Skin:    General: Skin is warm.  Neurological:     General: No focal deficit present.     Mental Status: She is alert and oriented to person, place, and time.  Psychiatric:        Mood and Affect: Mood normal.        Behavior: Behavior normal.        Thought Content: Thought content normal.    Results: None   Assessment & Plan:  Phyllis Santos is a 80 y.o. 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 She wants to have these excised. Discussed risk of bleeding, infection, recurrence, nerve injury on the face, and risk of having to do wound care.   -Plan for excision with anesthesia given the locations and size    All questions were answered to the satisfaction of the patient.     Virl Cagey 02/07/2021, 11:55 AM

## 2021-02-09 ENCOUNTER — Encounter (HOSPITAL_COMMUNITY): Payer: Medicare Other | Admitting: Physical Therapy

## 2021-02-14 ENCOUNTER — Encounter (HOSPITAL_COMMUNITY): Payer: Medicare Other | Admitting: Physical Therapy

## 2021-02-16 ENCOUNTER — Encounter (HOSPITAL_COMMUNITY): Payer: Medicare Other | Admitting: Physical Therapy

## 2021-02-23 NOTE — Patient Instructions (Signed)
Phyllis Santos  02/23/2021     @PREFPERIOPPHARMACY @   Your procedure is scheduled on  03/01/2021.   Report to [facility] at  [phone]  A.M.   Call this number if you have problems the morning of surgery:  [phone]   Remember:  Do not eat or drink after midnight.    Take these medicines the morning of surgery with A SIP OF WATER                                        None    Do not wear jewelry, make-up or nail polish.  Do not wear lotions, powders, or perfumes, or deodorant.  Do not shave 48 hours prior to surgery.  Men may shave face and neck.  Do not bring valuables to the hospital.  [facility] is not responsible for any belongings or valuables.  Contacts, dentures or bridgework may not be worn into surgery.  Leave your suitcase in the car.  After surgery it may be brought to your room.  For patients admitted to the hospital, discharge time will be determined by your treatment team.  Patients discharged the day of surgery will not be allowed to drive home and must have someone with them for 24 hours.    Special instructions:   DO NOT smoke tobacco or vape for 24 hours before your procedure.  Please read over the following fact sheets that you were given. Coughing and Deep Breathing, Surgical Site Infection Prevention, Anesthesia Post-op Instructions, and Care and Recovery After Surgery      Epidermoid Cyst Removal, Care After This sheet gives you information about how to care for yourself after your procedure. Your health care provider may also give you more specific instructions. If you have problems or questions, contact your health care provider. What can I expect after the procedure? After the procedure, it is common to have: Soreness in the area where your cyst was removed. Tightness or itchiness from the stitches (sutures) in your skin. Follow these instructions at home: Medicines Take over-the-counter and prescription medicines only as told  by your health care provider. If you were prescribed an antibiotic medicine or ointment, take or apply it as told by your health care provider. Do not stop using the antibiotic even if you start to feel better. Incision care  Follow instructions from your health care provider about how to take care of your incision. Make sure you: Wash your hands with soap and water for at least 20 seconds before you change your bandage (dressing). If soap and water are not available, use hand sanitizer. Change your dressing as told by your health care provider. Leave sutures, skin glue, or adhesive strips in place. These skin closures may need to stay in place for 1-2 weeks or longer. If adhesive strip edges start to loosen and curl up, you may trim the loose edges. Do not remove adhesive strips completely unless your health care provider tells you to do that. Keep the dressing dry until your health care provider says that it can be removed. After your dressing is off, check your incision area every day for signs of infection. Check for: Redness, swelling, or pain. Fluid or blood. Warmth. Pus or a bad smell. General instructions Do not take baths, swim, or use a hot tub until your health care provider approves.  Ask your health care provider if you may take showers. You may only be allowed to take sponge baths. Your health care provider may ask you to avoid contact sports or activities that take a lot of effort. Do not do anything that stretches or puts pressure on your incision. You can return to your normal diet. Keep all follow-up visits. This is important. Contact a health care provider if: You have a fever. You have redness, swelling, or pain in the incision area. You have fluid or blood coming from your incision. You have pus or a bad smell coming from your incision. Your incision feels warm to the touch. Your cyst grows back. Get help right away if: If the incision site suddenly increases in size  and you have pain at the incision site. You may be checked for a collection of blood under the skin from the procedure (hematoma). Summary After the procedure, it is common to have soreness in the area where your cyst was removed. Take or apply over-the-counter and prescription medicines only as told by your health care provider. Follow instructions from your health care provider about how to take care of your incision. This information is not intended to replace advice given to you by your health care provider. Make sure you discuss any questions you have with your health care provider. Document Revised: 08/05/2019 Document Reviewed: 08/05/2019 Elsevier Patient Education  [place] After This sheet gives you information about how to care for yourself after your procedure. Your health care provider may also give you more specific instructions. If you have problems or questions, contact your health care provider. What can I expect after the procedure? After the procedure, it is common to have: Tiredness. Forgetfulness about what happened after the procedure. Impaired judgment for important decisions. Nausea or vomiting. Some difficulty with balance. Follow these instructions at home: For the time period you were told by your health care provider:   Rest as needed. Do not participate in activities where you could fall or become injured. Do not drive or use machinery. Do not drink alcohol. Do not take sleeping pills or medicines that cause drowsiness. Do not make important decisions or sign legal documents. Do not take care of children on your own. Eating and drinking Follow the diet that is recommended by your health care provider. Drink enough fluid to keep your urine pale yellow. If you vomit: Drink water, juice, or soup when you can drink without vomiting. Make sure you have little or no nausea before eating solid foods. General  instructions Have a responsible adult stay with you for the time you are told. It is important to have someone help care for you until you are awake and alert. Take over-the-counter and prescription medicines only as told by your health care provider. If you have sleep apnea, surgery and certain medicines can increase your risk for breathing problems. Follow instructions from your health care provider about wearing your sleep device: Anytime you are sleeping, including during daytime naps. While taking prescription pain medicines, sleeping medicines, or medicines that make you drowsy. Avoid smoking. Keep all follow-up visits as told by your health care provider. This is important. Contact a health care provider if: You keep feeling nauseous or you keep vomiting. You feel light-headed. You are still sleepy or having trouble with balance after 24 hours. You develop a rash. You have a fever. You have redness or swelling around the IV site. Get help right away  if: You have trouble breathing. You have new-onset confusion at home. Summary For several hours after your procedure, you may feel tired. You may also be forgetful and have poor judgment. Have a responsible adult stay with you for the time you are told. It is important to have someone help care for you until you are awake and alert. Rest as told. Do not drive or operate machinery. Do not drink alcohol or take sleeping pills. Get help right away if you have trouble breathing, or if you suddenly become confused. This information is not intended to replace advice given to you by your health care provider. Make sure you discuss any questions you have with your health care provider. Document Revised: 01/14/2020 Document Reviewed: 04/02/2019 Elsevier Patient Education  2022 [place]. How to Use Chlorhexidine for Bathing Chlorhexidine gluconate (CHG) is a germ-killing (antiseptic) solution that is used to clean the skin. It can get rid of  the bacteria that normally live on the skin and can keep them away for about 24 hours. To clean your skin with CHG, you may be given: A CHG solution to use in the shower or as part of a sponge bath. A prepackaged cloth that contains CHG. Cleaning your skin with CHG may help lower the risk for infection: While you are staying in the intensive care unit of the hospital. If you have a vascular access, such as a central line, to provide short-term or long-term access to your veins. If you have a catheter to drain urine from your bladder. If you are on a ventilator. A ventilator is a machine that helps you breathe by moving air in and out of your lungs. After surgery. What are the risks? Risks of using CHG include: A skin reaction. Hearing loss, if CHG gets in your ears and you have a perforated eardrum. Eye injury, if CHG gets in your eyes and is not rinsed out. The CHG product catching fire. Make sure that you avoid smoking and flames after applying CHG to your skin. Do not use CHG: If you have a chlorhexidine allergy or have previously reacted to chlorhexidine. On babies younger than 39 months of age. How to use CHG solution Use CHG only as told by your health care provider, and follow the instructions on the label. Use the full amount of CHG as directed. Usually, this is one bottle. During a shower Follow these steps when using CHG solution during a shower (unless your health care provider gives you different instructions): Start the shower. Use your normal soap and shampoo to wash your face and hair. Turn off the shower or move out of the shower stream. Pour the CHG onto a clean washcloth. Do not use any type of brush or rough-edged sponge. Starting at your neck, lather your body down to your toes. Make sure you follow these instructions: If you will be having surgery, pay special attention to the part of your body where you will be having surgery. Scrub this area for at least 1  minute. Do not use CHG on your head or face. If the solution gets into your ears or eyes, rinse them well with water. Avoid your genital area. Avoid any areas of skin that have broken skin, cuts, or scrapes. Scrub your back and under your arms. Make sure to wash skin folds. Let the lather sit on your skin for 1-2 minutes or as long as told by your health care provider. Thoroughly rinse your entire body in the shower. Make  sure that all body creases and crevices are rinsed well. Dry off with a clean towel. Do not put any substances on your body afterward--such as powder, lotion, or perfume--unless you are told to do so by your health care provider. Only use lotions that are recommended by the manufacturer. Put on clean clothes or pajamas. If it is the night before your surgery, sleep in clean sheets.  During a sponge bath Follow these steps when using CHG solution during a sponge bath (unless your health care provider gives you different instructions): Use your normal soap and shampoo to wash your face and hair. Pour the CHG onto a clean washcloth. Starting at your neck, lather your body down to your toes. Make sure you follow these instructions: If you will be having surgery, pay special attention to the part of your body where you will be having surgery. Scrub this area for at least 1 minute. Do not use CHG on your head or face. If the solution gets into your ears or eyes, rinse them well with water. Avoid your genital area. Avoid any areas of skin that have broken skin, cuts, or scrapes. Scrub your back and under your arms. Make sure to wash skin folds. Let the lather sit on your skin for 1-2 minutes or as long as told by your health care provider. Using a different clean, wet washcloth, thoroughly rinse your entire body. Make sure that all body creases and crevices are rinsed well. Dry off with a clean towel. Do not put any substances on your body afterward--such as powder, lotion, or  perfume--unless you are told to do so by your health care provider. Only use lotions that are recommended by the manufacturer. Put on clean clothes or pajamas. If it is the night before your surgery, sleep in clean sheets. How to use CHG prepackaged cloths Only use CHG cloths as told by your health care provider, and follow the instructions on the label. Use the CHG cloth on clean, dry skin. Do not use the CHG cloth on your head or face unless your health care provider tells you to. When washing with the CHG cloth: Avoid your genital area. Avoid any areas of skin that have broken skin, cuts, or scrapes. Before surgery Follow these steps when using a CHG cloth to clean before surgery (unless your health care provider gives you different instructions): Using the CHG cloth, vigorously scrub the part of your body where you will be having surgery. Scrub using a back-and-forth motion for 3 minutes. The area on your body should be completely wet with CHG when you are done scrubbing. Do not rinse. Discard the cloth and let the area air-dry. Do not put any substances on the area afterward, such as powder, lotion, or perfume. Put on clean clothes or pajamas. If it is the night before your surgery, sleep in clean sheets.  For general bathing Follow these steps when using CHG cloths for general bathing (unless your health care provider gives you different instructions). Use a separate CHG cloth for each area of your body. Make sure you wash between any folds of skin and between your fingers and toes. Wash your body in the following order, switching to a new cloth after each step: The front of your neck, shoulders, and chest. Both of your arms, under your arms, and your hands. Your stomach and groin area, avoiding the genitals. Your right leg and foot. Your left leg and foot. The back of your neck, your back, and  your buttocks. Do not rinse. Discard the cloth and let the area air-dry. Do not put any  substances on your body afterward--such as powder, lotion, or perfume--unless you are told to do so by your health care provider. Only use lotions that are recommended by the manufacturer. Put on clean clothes or pajamas. Contact a health care provider if: Your skin gets irritated after scrubbing. You have questions about using your solution or cloth. You swallow any chlorhexidine. Call your local poison control center (1-639-671-2020 in the [place] Get help right away if: Your eyes itch badly, or they become very red or swollen. Your skin itches badly and is red or swollen. Your hearing changes. You have trouble seeing. You have swelling or tingling in your mouth or throat. You have trouble breathing. These symptoms may represent a serious problem that is an emergency. Do not wait to see if the symptoms will go away. Get medical help right away. Call your local emergency services (911 in the [place] Do not drive yourself to the hospital. Summary Chlorhexidine gluconate (CHG) is a germ-killing (antiseptic) solution that is used to clean the skin. Cleaning your skin with CHG may help to lower your risk for infection. You may be given CHG to use for bathing. It may be in a bottle or in a prepackaged cloth to use on your skin. Carefully follow your health care provider's instructions and the instructions on the product label. Do not use CHG if you have a chlorhexidine allergy. Contact your health care provider if your skin gets irritated after scrubbing. This information is not intended to replace advice given to you by your health care provider. Make sure you discuss any questions you have with your health care provider. Document Revised: 07/11/2020 Document Reviewed: 07/11/2020 Elsevier Patient Education  2022 [employer].

## 2021-02-27 ENCOUNTER — Other Ambulatory Visit: Payer: Self-pay

## 2021-02-27 ENCOUNTER — Encounter (HOSPITAL_COMMUNITY)
Admission: RE | Admit: 2021-02-27 | Discharge: 2021-02-27 | Disposition: A | Discharge status: Home / Self Care | Payer: Medicare Other | Source: Ambulatory Visit | Attending: General Surgery | Admitting: General Surgery

## 2021-02-27 DIAGNOSIS — Z01818 Encounter for other preprocedural examination: Secondary | ICD-10-CM | POA: Insufficient documentation

## 2021-02-27 LAB — BASIC METABOLIC PANEL
Anion gap: 8 (ref 5–15)
BUN: 16 mg/dL (ref 8–23)
CO2: 29 mmol/L (ref 22–32)
Calcium: 9.3 mg/dL (ref 8.9–10.3)
Chloride: 101 mmol/L (ref 98–111)
Creatinine, Ser: 0.63 mg/dL (ref 0.44–1.00)
GFR, Estimated: >60 mL/min (ref >60)
Glucose, Bld: 77 mg/dL (ref 70–99)
Potassium: 3.1 mmol/L — ABNORMAL LOW (ref 3.5–5.1)
Sodium: 138 mmol/L (ref 135–145)

## 2021-03-01 ENCOUNTER — Encounter (HOSPITAL_COMMUNITY)
Admission: RE | Disposition: A | Discharge status: Home / Self Care | Payer: Self-pay | Source: Ambulatory Visit | Attending: General Surgery

## 2021-03-01 ENCOUNTER — Ambulatory Visit (HOSPITAL_COMMUNITY)
Admission: RE | Admit: 2021-03-01 | Discharge: 2021-03-01 | Disposition: A | Discharge status: Home / Self Care | Payer: Medicare Other | Source: Ambulatory Visit | Attending: General Surgery | Admitting: General Surgery

## 2021-03-01 ENCOUNTER — Ambulatory Visit (HOSPITAL_COMMUNITY): Payer: Medicare Other | Admitting: Anesthesiology

## 2021-03-01 ENCOUNTER — Encounter (HOSPITAL_COMMUNITY): Payer: Self-pay | Admitting: General Surgery

## 2021-03-01 DIAGNOSIS — Z885 Allergy status to narcotic agent status: Secondary | ICD-10-CM | POA: Diagnosis not present

## 2021-03-01 DIAGNOSIS — I10 Essential (primary) hypertension: Secondary | ICD-10-CM | POA: Insufficient documentation

## 2021-03-01 DIAGNOSIS — L723 Sebaceous cyst: Secondary | ICD-10-CM

## 2021-03-01 DIAGNOSIS — L720 Epidermal cyst: Secondary | ICD-10-CM | POA: Insufficient documentation

## 2021-03-01 DIAGNOSIS — Z6838 Body mass index (BMI) 38.0-38.9, adult: Secondary | ICD-10-CM | POA: Diagnosis not present

## 2021-03-01 DIAGNOSIS — Z880 Allergy status to penicillin: Secondary | ICD-10-CM | POA: Insufficient documentation

## 2021-03-01 DIAGNOSIS — Z881 Allergy status to other antibiotic agents status: Secondary | ICD-10-CM | POA: Diagnosis not present

## 2021-03-01 DIAGNOSIS — Z87891 Personal history of nicotine dependence: Secondary | ICD-10-CM | POA: Diagnosis not present

## 2021-03-01 HISTORY — PX: CYST EXCISION: SHX5701

## 2021-03-01 HISTORY — PX: EXCISION OF BACK LESION: SHX6597

## 2021-03-01 SURGERY — CYST REMOVAL
Anesthesia: General | Site: Chin | Laterality: Right

## 2021-03-01 MED ORDER — PROPOFOL 10 MG/ML IV BOLUS
INTRAVENOUS | Status: AC
  Filled 2021-03-01: qty 40

## 2021-03-01 MED ORDER — ONDANSETRON HCL 4 MG/2ML IJ SOLN: 4.0000 mg | Freq: Once | INTRAMUSCULAR | Status: DC | PRN

## 2021-03-01 MED ORDER — CIPROFLOXACIN IN D5W 400 MG/200ML IV SOLN
INTRAVENOUS | Status: AC
  Filled 2021-03-01: qty 200

## 2021-03-01 MED ORDER — LIDOCAINE HCL (CARDIAC) PF 100 MG/5ML IV SOSY
PREFILLED_SYRINGE | INTRAVENOUS | Status: DC | PRN
  Administered 2021-03-01: 60 mg via INTRATRACHEAL

## 2021-03-01 MED ORDER — LIDOCAINE HCL (PF) 1 % IJ SOLN
INTRAMUSCULAR | Status: AC
  Filled 2021-03-01: qty 30

## 2021-03-01 MED ORDER — ORAL CARE MOUTH RINSE: 15.0000 mL | Freq: Once | OROMUCOSAL | Status: AC

## 2021-03-01 MED ORDER — EPHEDRINE 5 MG/ML INJ
INTRAVENOUS | Status: AC
  Filled 2021-03-01: qty 5

## 2021-03-01 MED ORDER — TRAMADOL HCL 50 MG PO TABS: 50.0000 mg | ORAL_TABLET | Freq: Four times a day (QID) | ORAL | 0 refills | Status: DC | PRN

## 2021-03-01 MED ORDER — CHLORHEXIDINE GLUCONATE 0.12 % MT SOLN
15.0000 mL | Freq: Once | OROMUCOSAL | Status: AC
  Administered 2021-03-01: 15 mL via OROMUCOSAL

## 2021-03-01 MED ORDER — CHLORHEXIDINE GLUCONATE CLOTH 2 % EX PADS: 6.0000 | MEDICATED_PAD | Freq: Once | CUTANEOUS | Status: DC

## 2021-03-01 MED ORDER — FENTANYL CITRATE PF 50 MCG/ML IJ SOSY: 25.0000 ug | PREFILLED_SYRINGE | INTRAMUSCULAR | Status: DC | PRN

## 2021-03-01 MED ORDER — CHLORHEXIDINE GLUCONATE 0.12 % MT SOLN
OROMUCOSAL | Status: AC
  Filled 2021-03-01: qty 15

## 2021-03-01 MED ORDER — LIDOCAINE HCL (PF) 2 % IJ SOLN
INTRAMUSCULAR | Status: AC
  Filled 2021-03-01: qty 5

## 2021-03-01 MED ORDER — PROPOFOL 500 MG/50ML IV EMUL
INTRAVENOUS | Status: DC | PRN
  Administered 2021-03-01: 50 ug/kg/min via INTRAVENOUS

## 2021-03-01 MED ORDER — FENTANYL CITRATE (PF) 100 MCG/2ML IJ SOLN
INTRAMUSCULAR | Status: AC
  Filled 2021-03-01: qty 2

## 2021-03-01 MED ORDER — PROPOFOL 10 MG/ML IV BOLUS
INTRAVENOUS | Status: AC
  Filled 2021-03-01: qty 20

## 2021-03-01 MED ORDER — CIPROFLOXACIN IN D5W 400 MG/200ML IV SOLN
400.0000 mg | INTRAVENOUS | Status: AC
  Administered 2021-03-01: 400 mg via INTRAVENOUS

## 2021-03-01 MED ORDER — LACTATED RINGERS IV SOLN: INTRAVENOUS | Status: DC

## 2021-03-01 MED ORDER — BUPIVACAINE HCL (PF) 0.5 % IJ SOLN
INTRAMUSCULAR | Status: AC
  Filled 2021-03-01: qty 30

## 2021-03-01 MED ORDER — FENTANYL CITRATE (PF) 100 MCG/2ML IJ SOLN
INTRAMUSCULAR | Status: DC | PRN
  Administered 2021-03-01 (×4): 25 ug via INTRAVENOUS

## 2021-03-01 MED ORDER — LIDOCAINE HCL (PF) 1 % IJ SOLN
INTRAMUSCULAR | Status: DC | PRN
  Administered 2021-03-01: 10 mL

## 2021-03-01 MED ORDER — PROPOFOL 10 MG/ML IV BOLUS
INTRAVENOUS | Status: DC | PRN
  Administered 2021-03-01: 30 mg via INTRAVENOUS
  Administered 2021-03-01: 40 mg via INTRAVENOUS
  Administered 2021-03-01: 30 mg via INTRAVENOUS
  Administered 2021-03-01: 40 mg via INTRAVENOUS

## 2021-03-01 MED ORDER — EPHEDRINE SULFATE 50 MG/ML IJ SOLN
INTRAMUSCULAR | Status: DC | PRN
  Administered 2021-03-01 (×2): 10 mg via INTRAVENOUS

## 2021-03-01 MED ORDER — 0.9 % SODIUM CHLORIDE (POUR BTL) OPTIME
TOPICAL | Status: DC | PRN
  Administered 2021-03-01: 1000 mL

## 2021-03-01 MED ORDER — ONDANSETRON HCL 4 MG PO TABS: 4.0000 mg | ORAL_TABLET | Freq: Three times a day (TID) | ORAL | 1 refills | Status: DC | PRN

## 2021-03-01 MED ORDER — BUPIVACAINE HCL (PF) 0.5 % IJ SOLN: INTRAMUSCULAR | Status: DC | PRN

## 2021-03-01 SURGICAL SUPPLY — 39 items
APL PRP STRL LF ISPRP CHG 10.5 (MISCELLANEOUS) ×6
APL SKNCLS STERI-STRIP NONHPOA (GAUZE/BANDAGES/DRESSINGS) ×4
APPLICATOR CHLORAPREP 10.5 ORG (MISCELLANEOUS) ×5 IMPLANT
BENZOIN TINCTURE PRP APPL 2/3 (GAUZE/BANDAGES/DRESSINGS) ×2 IMPLANT
BLADE SURG 15 STRL LF DISP TIS (BLADE) IMPLANT
BLADE SURG 15 STRL SS (BLADE) ×3
CLOTH BEACON ORANGE TIMEOUT ST (SAFETY) ×3 IMPLANT
COVER LIGHT HANDLE STERIS (MISCELLANEOUS) ×6 IMPLANT
DRAPE EENT ADH APERT 31X51 STR (DRAPES) ×1 IMPLANT
DRAPE UTILITY W/TAPE 26X15 (DRAPES) ×1 IMPLANT
DRSG TEGADERM 2-3/8X2-3/4 SM (GAUZE/BANDAGES/DRESSINGS) ×1 IMPLANT
DRSG TEGADERM 4X4.75 (GAUZE/BANDAGES/DRESSINGS) ×2 IMPLANT
ELECT NDL TIP 2.8 STRL (NEEDLE) ×2 IMPLANT
ELECT NEEDLE TIP 2.8 STRL (NEEDLE) ×3 IMPLANT
ELECT REM PT RETURN 9FT ADLT (ELECTROSURGICAL) ×3
ELECTRODE REM PT RTRN 9FT ADLT (ELECTROSURGICAL) ×2 IMPLANT
GLOVE SURG ENC MOIS LTX SZ6.5 (GLOVE) ×6 IMPLANT
GLOVE SURG UNDER POLY LF SZ6.5 (GLOVE) ×3 IMPLANT
GLOVE SURG UNDER POLY LF SZ7 (GLOVE) ×6 IMPLANT
GOWN STRL REUS W/TWL LRG LVL3 (GOWN DISPOSABLE) ×6 IMPLANT
KIT TURNOVER KIT A (KITS) ×3 IMPLANT
MANIFOLD NEPTUNE II (INSTRUMENTS) ×3 IMPLANT
NDL HYPO 25X1 1.5 SAFETY (NEEDLE) ×2 IMPLANT
NEEDLE HYPO 25X1 1.5 SAFETY (NEEDLE) ×3 IMPLANT
NS IRRIG 1000ML POUR BTL (IV SOLUTION) ×3 IMPLANT
PACK MINOR (CUSTOM PROCEDURE TRAY) IMPLANT
PAD ARMBOARD 7.5X6 YLW CONV (MISCELLANEOUS) ×5 IMPLANT
PENCIL HANDSWITCHING (ELECTRODE) ×1 IMPLANT
SET BASIN LINEN APH (SET/KITS/TRAYS/PACK) ×3 IMPLANT
SPONGE GAUZE 2X2 8PLY STRL LF (GAUZE/BANDAGES/DRESSINGS) ×3 IMPLANT
SUT ETHILON 3 0 FSL (SUTURE) IMPLANT
SUT ETHILON 3 0 PS 1 (SUTURE) ×2 IMPLANT
SUT MNCRL AB 4-0 PS2 18 (SUTURE) ×3 IMPLANT
SUT PROLENE 4 0 PS 2 18 (SUTURE) IMPLANT
SUT VIC AB 3-0 SH 27 (SUTURE) ×6
SUT VIC AB 3-0 SH 27X BRD (SUTURE) IMPLANT
SYR 30ML LL (SYRINGE) ×2 IMPLANT
SYR BULB IRRIG 60ML STRL (SYRINGE) ×3 IMPLANT
SYR CONTROL 10ML LL (SYRINGE) ×3 IMPLANT

## 2021-03-01 NOTE — Anesthesia Postprocedure Evaluation (Signed)
Anesthesia Post Note  Patient: Ellen Henri  Procedure(s) Performed: EXCISION CYST, CHIN (Right: Chin) EXCISION OF BACK CYST (Right: Back)  Patient location during evaluation: Phase II Anesthesia Type: General Level of consciousness: awake Pain management: pain level controlled Vital Signs Assessment: post-procedure vital signs reviewed and stable Respiratory status: spontaneous breathing and respiratory function stable Cardiovascular status: blood pressure returned to baseline and stable Postop Assessment: no headache and no apparent nausea or vomiting Anesthetic complications: no Comments: Late entry   No notable events documented.   Last Vitals:  Vitals:   03/01/21 0647 03/01/21 0916  BP: (!) 105/45 105/60  Pulse:  75  Resp: 20 18  Temp: 36.9 C 36.7 C  SpO2: 97% 96%    Last Pain:  Vitals:   03/01/21 0916  TempSrc: Oral  PainSc: 0-No pain                 Louann Sjogren

## 2021-03-01 NOTE — Transfer of Care (Signed)
Immediate Anesthesia Transfer of Care Note  Patient: Phyllis Santos  Procedure(s) Performed: EXCISION CYST, CHIN (Right: Chin) EXCISION OF BACK CYST (Right: Back)  Patient Location: Short Stay  Anesthesia Type:General  Level of Consciousness: awake, alert  and oriented  Airway & Oxygen Therapy: Patient Spontanous Breathing  Post-op Assessment: Report given to RN and Post -op Vital signs reviewed and stable  Post vital signs: Reviewed and stable  Last Vitals:  Vitals Value Taken Time  BP    Temp    Pulse    Resp    SpO2      Last Pain:  Vitals:   03/01/21 0647  TempSrc: Oral  PainSc: 0-No pain      Patients Stated Pain Goal: 5 (31/67/42 [id number])  Complications: No notable events documented.

## 2021-03-01 NOTE — Anesthesia Procedure Notes (Signed)
Procedure Name: MAC Date/Time: 03/01/2021 7:44 AM Performed by: Karna Dupes, CRNA Pre-anesthesia Checklist: Patient identified, Emergency Drugs available, Suction available and Patient being monitored Oxygen Delivery Method: Nasal cannula

## 2021-03-01 NOTE — Anesthesia Preprocedure Evaluation (Addendum)
Anesthesia Evaluation  Patient identified by MRN, date of birth, ID band Patient awake    Reviewed: Allergy & Precautions, H&P , NPO status , Patient's Chart, lab work & pertinent test results, reviewed documented beta blocker date and time   Airway Mallampati: II  TM Distance: >3 FB Neck ROM: full    Dental no notable dental hx.    Pulmonary neg pulmonary ROS, former smoker,    Pulmonary exam normal breath sounds clear to auscultation       Cardiovascular Exercise Tolerance: Good hypertension, negative cardio ROS   Rhythm:regular Rate:Normal     Neuro/Psych negative neurological ROS  negative psych ROS   GI/Hepatic negative GI ROS, Neg liver ROS,   Endo/Other  Morbid obesity  Renal/GU negative Renal ROS  negative genitourinary   Musculoskeletal   Abdominal   Peds  Hematology negative hematology ROS (+)   Anesthesia Other Findings   Reproductive/Obstetrics negative OB ROS                            Anesthesia Physical Anesthesia Plan  ASA: 3  Anesthesia Plan: General   Post-op Pain Management:    Induction:   PONV Risk Score and Plan:   Airway Management Planned:   Additional Equipment:   Intra-op Plan:   Post-operative Plan:   Informed Consent: I have reviewed the patients History and Physical, chart, labs and discussed the procedure including the risks, benefits and alternatives for the proposed anesthesia with the patient or authorized representative who has indicated his/her understanding and acceptance.     Dental Advisory Given  Plan Discussed with: CRNA  Anesthesia Plan Comments:        Anesthesia Quick Evaluation

## 2021-03-01 NOTE — Progress Notes (Signed)
[facility]   Updated son Phyllis Santos about surgery being done. Phyllis Santos does not have her phone but is here at the hospital. Rx sent to pharmacy. Wound instructions written out. Will see patient in office next week to remove sutures from face.   Curlene Labrum, MD [facility] [address] [place], [place] [address] [phone] (office)

## 2021-03-01 NOTE — Discharge Instructions (Signed)
Discharge Instructions:  Common Complaints: Pain at the incision site is common.  Bruising at the sites.   Diet/ Activity: Diet as tolerated.  Remove the clear bandages and the gauze on 03/03/2021. You can shower before then but avoid getting those bandages wet.  After you remove the bandages, the stitches can get wet in the shower. Just pat them dry.  After you remove the bandages, place neosporin to the areas and cover with large bandaid.  Shower per your regular routine daily.  Walk everyday for at least 15-20 minutes. Deep cough and move around every 1-2 hours in the first few days after surgery.  Limit excessive movement, lifting > 10 lbs, stretching with the limb if there is an incision on your arm/armpit or leg.   Limit stretching, pulling on your incision if it is located on other parts of your body.    Medication: Take tylenol and ibuprofen as needed for pain control, alternating every 4-6 hours.  Example:  Tylenol 1000mg  @ 6am, 12noon, 6pm, 38midnight (Do not exceed 4000mg  of tylenol a day). Ibuprofen 800mg  @ 9am, 3pm, 9pm, 3am (Do not exceed 3600mg  of ibuprofen a day).  Take Tramadol for breakthrough pain every 4 hours.  Take Colace for constipation related to narcotic pain medication. If you do not have a bowel movement in 2 days, take Miralax over the counter.  Drink plenty of water to also prevent constipation.   Contact Information: If you have questions or concerns, please call our office, [phone], Monday- Thursday 8AM-5PM and Friday 8AM-12Noon.  If it is after hours or on the weekend, please call [facility], [phone], [phone], and ask to speak to the surgeon on call for Dr. Constance Haw at [facility].

## 2021-03-01 NOTE — Interval H&P Note (Signed)
History and Physical Interval Note:  03/01/2021 7:27 AM  Phyllis Santos  has presented today for surgery, with the diagnosis of Sebaceous cysts, chin, back.  The various methods of treatment have been discussed with the patient and family. After consideration of risks, benefits and other options for treatment, the patient has consented to  Procedure(s): EXCISION CYST, CHIN (Right) EXCISION OF BACK CYST (Right) as a surgical intervention.  The patient's history has been reviewed, patient examined, no change in status, stable for surgery.  I have reviewed the patient's chart and labs.  Questions were answered to the patient's satisfaction.   Marked neck and back cyst  Virl Cagey

## 2021-03-01 NOTE — Op Note (Signed)
[facility] Operative Note  03/01/21  Preoperative Diagnosis: Sebaceous cyst on right chin/ jawline and sebaceous cyst on lower mid back    Postoperative Diagnosis: Same   Procedure(s) Performed: Excision of cyst on right chin 2cm, excision of cyst on lower back 3cm    Surgeon: Lanell Matar. Constance Haw, MD   Assistants: No qualified resident was available    Anesthesia: Monitored anesthesia care    Anesthesiologist: Dr. Briant Cedar    Specimens: Face cyst, Back cyst    Estimated Blood Loss: Minimal   Blood Replacement: None    Complications: None   Wound Class: Clean contaminated    Operative Indications: Phyllis Santos is a 80 yo with a history of an enlarging cyst on her right chin/ jaw line and her lower back. We discussed excision under sedation given the size and location. Discussed the risk of bleeding, infection, and recurrence and nerve injury at the jawline level.   Findings: Large 2 cm cyst on right chin/ jaw line, large 3cm cyst on lower back    Procedure: The patient was taken to the operating room and placed in the left lateral decubitus position. Monitored anesthesia was induced. Intravenous antibiotics were administered per protocol.  The right lower face and chin/ jaw was prepped and draped and the lower mid back was prepped and draped in the usual sterile fashion.   An incision was made over the cyst on the right chin and carried down to the cyst under the dermal layer. The cyst was entered. With sharp dissection with scissors, the cyst wall was removed in its entirety and sent to pathology. The specimen was 2 cm in size. The cavity was made hemostatic and irrigated. The deep space was closed with interrupted 3-0 Vicyrl and the skin was closed with 3-0 interrupted Nylon. Gauze and a Tegaderm secured with benzoin was placed.   The lower back cyst had a large black central pit. The central pit was excised in an elliptical manner and the incision was carried  down into the subcutaneous tissue. The cyst was entered and some contents were evacuated. The cyst wall in its entirety was removed and sent to pathology. The cavity was made hemostatic and irrigated. The specimen was 3cm in size. The deep space was closed with interrupted 3-0 Vicyrl and the skin was closed with 3-0 interrupted Nylon. Gauze and a Tegaderm secured with benzoin was placed.   Final inspection revealed acceptable hemostasis. All counts were correct at the end of the case. The patient was awakened from anesthesia without complication.  The patient went to the PACU in stable condition.   Phyllis Labrum, MD [facility] [address] [place], [place] [address] [phone] (office)

## 2021-03-02 ENCOUNTER — Encounter (HOSPITAL_COMMUNITY): Payer: Self-pay | Admitting: General Surgery

## 2021-03-02 LAB — SURGICAL PATHOLOGY

## 2021-03-09 ENCOUNTER — Ambulatory Visit (INDEPENDENT_AMBULATORY_CARE_PROVIDER_SITE_OTHER): Payer: Medicare Other | Admitting: General Surgery

## 2021-03-09 ENCOUNTER — Encounter: Payer: Self-pay | Admitting: General Surgery

## 2021-03-09 ENCOUNTER — Other Ambulatory Visit: Payer: Self-pay

## 2021-03-09 ENCOUNTER — Encounter: Payer: Medicare Other | Admitting: General Surgery

## 2021-03-09 VITALS — BP 159/79 | HR 65 | Temp 98.4°F | Resp 14 | Ht 62.0 in | Wt 211.0 lb

## 2021-03-09 DIAGNOSIS — L723 Sebaceous cyst: Secondary | ICD-10-CM

## 2021-03-09 NOTE — Progress Notes (Signed)
[facility]  Face incision healing, back incision with some bloody drainage.   Pathology:  FINAL MICROSCOPIC DIAGNOSIS:   A. CYST, CHIN, RIGHT, EXCISION:  - Benign epidermal cyst.  - No evidence of malignancy.   B. CYST, BACK, LOWER, EXCISION:  - Benign epidermal cyst.  - No evidence of malignancy.  BP (!) 159/79   Pulse 65   Temp 98.4 F (36.9 C) (Other (Comment))   Resp 14   Ht 5\' 2"  (1.575 m)   Wt 211 lb (95.7 kg)   SpO2 96%   BMI 38.59 kg/m  Sutures removed from chin, steri strips applied, no erythema or drainage Back incision intact, minor erythema at the edges, bloody drainage on bandaid  Patient s/p excision of cyst on back and right chin/ jawline.  Steri strips will peel off face in about 7 days. Do not pick or peel at them until then.  Ok to shower. Continue to change bandage on back daily and do neosporin. Lovey Newcomer will remove the back sutures next week. If you have any concerns for worsening drainage or redness, call the office.    Future Appointments  Date Time Provider [place]  03/16/2021 12:00 PM Aviva Signs, MD [facility] None    Curlene Labrum, MD [facility] [address] [place], [place] [address] [phone] (office)

## 2021-03-09 NOTE — Patient Instructions (Signed)
Steri strips will peel off face in about 7 days. Do not pick or peel at them until then.  Ok to shower. Continue to change bandage on back daily and do neosporin. Lovey Newcomer will remove the back sutures next week. If you have any concerns for worsening drainage or redness, call the office.

## 2021-03-10 ENCOUNTER — Other Ambulatory Visit (HOSPITAL_COMMUNITY): Payer: Self-pay | Admitting: Internal Medicine

## 2021-03-10 ENCOUNTER — Other Ambulatory Visit (HOSPITAL_COMMUNITY): Payer: Self-pay | Admitting: Dermatology

## 2021-03-10 DIAGNOSIS — Z1231 Encounter for screening mammogram for malignant neoplasm of breast: Secondary | ICD-10-CM

## 2021-03-13 DIAGNOSIS — I10 Essential (primary) hypertension: Secondary | ICD-10-CM | POA: Diagnosis not present

## 2021-03-13 DIAGNOSIS — E1165 Type 2 diabetes mellitus with hyperglycemia: Secondary | ICD-10-CM | POA: Diagnosis not present

## 2021-03-13 DIAGNOSIS — E782 Mixed hyperlipidemia: Secondary | ICD-10-CM | POA: Diagnosis not present

## 2021-03-16 ENCOUNTER — Telehealth: Payer: Self-pay | Admitting: Family Medicine

## 2021-03-16 ENCOUNTER — Encounter: Payer: Medicare Other | Admitting: General Surgery

## 2021-03-16 NOTE — Telephone Encounter (Signed)
Patient in for removal of sutures on her back. Removed sutures and wound dehisced at the bottom with some clear drainage. Dr. Arnoldo Morale looked at wound and stated it looks ok to clean out with peroxide, steristrip the top part of the wound and cover. Wound cleaned with peroxide, steristrips placed on top portion of wound and covered whole wound with with bandage.   Pt will call if she has any issues or would like to be seen.

## 2021-03-27 ENCOUNTER — Other Ambulatory Visit: Payer: Self-pay

## 2021-03-27 ENCOUNTER — Ambulatory Visit (HOSPITAL_COMMUNITY)
Admission: RE | Admit: 2021-03-27 | Discharge: 2021-03-27 | Disposition: A | Discharge status: Home / Self Care | Payer: Medicare Other | Source: Ambulatory Visit | Attending: Internal Medicine | Admitting: Internal Medicine

## 2021-03-27 DIAGNOSIS — Z1231 Encounter for screening mammogram for malignant neoplasm of breast: Secondary | ICD-10-CM | POA: Insufficient documentation

## 2021-04-12 DIAGNOSIS — I10 Essential (primary) hypertension: Secondary | ICD-10-CM | POA: Diagnosis not present

## 2021-04-12 DIAGNOSIS — E782 Mixed hyperlipidemia: Secondary | ICD-10-CM | POA: Diagnosis not present

## 2021-04-21 DIAGNOSIS — M791 Myalgia, unspecified site: Secondary | ICD-10-CM | POA: Diagnosis not present

## 2021-04-21 DIAGNOSIS — J029 Acute pharyngitis, unspecified: Secondary | ICD-10-CM | POA: Diagnosis not present

## 2021-04-21 DIAGNOSIS — R059 Cough, unspecified: Secondary | ICD-10-CM | POA: Diagnosis not present

## 2021-04-21 DIAGNOSIS — J069 Acute upper respiratory infection, unspecified: Secondary | ICD-10-CM | POA: Diagnosis not present

## 2021-04-24 ENCOUNTER — Encounter (HOSPITAL_COMMUNITY): Payer: Self-pay | Admitting: Emergency Medicine

## 2021-04-24 ENCOUNTER — Emergency Department (HOSPITAL_COMMUNITY): Payer: Medicare Other

## 2021-04-24 ENCOUNTER — Emergency Department (HOSPITAL_COMMUNITY)
Admission: EM | Admit: 2021-04-24 | Discharge: 2021-04-24 | Disposition: A | Discharge status: Home / Self Care | Payer: Medicare Other | Attending: Emergency Medicine | Admitting: Emergency Medicine

## 2021-04-24 DIAGNOSIS — Z7982 Long term (current) use of aspirin: Secondary | ICD-10-CM | POA: Insufficient documentation

## 2021-04-24 DIAGNOSIS — I10 Essential (primary) hypertension: Secondary | ICD-10-CM | POA: Insufficient documentation

## 2021-04-24 DIAGNOSIS — Z79899 Other long term (current) drug therapy: Secondary | ICD-10-CM | POA: Diagnosis not present

## 2021-04-24 DIAGNOSIS — J209 Acute bronchitis, unspecified: Secondary | ICD-10-CM | POA: Diagnosis not present

## 2021-04-24 DIAGNOSIS — I517 Cardiomegaly: Secondary | ICD-10-CM | POA: Diagnosis not present

## 2021-04-24 DIAGNOSIS — Z20822 Contact with and (suspected) exposure to covid-19: Secondary | ICD-10-CM | POA: Diagnosis not present

## 2021-04-24 DIAGNOSIS — Z87891 Personal history of nicotine dependence: Secondary | ICD-10-CM | POA: Diagnosis not present

## 2021-04-24 DIAGNOSIS — R059 Cough, unspecified: Secondary | ICD-10-CM | POA: Diagnosis present

## 2021-04-24 LAB — RESP PANEL BY RT-PCR (FLU A&B, COVID) ARPGX2
Influenza A by PCR: NEGATIVE
Influenza B by PCR: NEGATIVE
SARS Coronavirus 2 by RT PCR: NEGATIVE

## 2021-04-24 MED ORDER — DOXYCYCLINE HYCLATE 100 MG PO CAPS: 100.0000 mg | ORAL_CAPSULE | Freq: Two times a day (BID) | ORAL | 0 refills | Status: DC

## 2021-04-24 NOTE — Discharge Instructions (Signed)
Get plenty of rest and drink a lot of fluids especially water.  Use Robitussin-DM for cough.  We sent a prescription for an antibiotic to your pharmacy.  See your doctor if not better in a few days.  Your COVID and influenza test will be back within an hour or 2.  You can check them on MyChart.

## 2021-04-24 NOTE — ED Provider Notes (Signed)
[facility] EMERGENCY DEPARTMENT Provider Note   CSN: [id number] Arrival date & time: 04/24/21  0636     History No chief complaint on file.   Phyllis Santos is a 80 y.o. female.  HPI She complains of productive cough with yellow sputum for several days.  She denies fever, chills, nausea or vomiting.  She saw her PCP, 3 days ago and had a COVID test which was done and negative by her report.  She has had prior COVID vaccines.  No known sick contacts recently.  She came here by private vehicle, driving herself.  There are no other known active modifying factors    Past Medical History:  Diagnosis Date   Hypercholesterolemia    Hypertension     Patient Active Problem List   Diagnosis Date Noted   Cyst, dermoid, face 02/07/2021   Sebaceous cyst 02/07/2021   Family history of colon cancer 04/27/2016   Heme + stool 04/27/2016    Past Surgical History:  Procedure Laterality Date   APPENDECTOMY     Dr Wonda Olds   CARPAL TUNNEL RELEASE     right-Dr Luna Glasgow   CATARACT EXTRACTION [facility]  08/16/2011   Procedure: CATARACT EXTRACTION PHACO AND INTRAOCULAR LENS PLACEMENT ([place]);  Surgeon: Tonny Branch, MD;  Location: [facility] ORS;  Service: Ophthalmology;  Laterality: Left;  CDE: 12.45   CATARACT EXTRACTION W/PHACO  09/03/2011   Procedure: CATARACT EXTRACTION PHACO AND INTRAOCULAR LENS PLACEMENT ([facility]);  Surgeon: Tonny Branch, MD;  Location: [facility] ORS;  Service: Ophthalmology;  Laterality: Right;  CDE:13.47   CHOLECYSTECTOMY     [facility]   COLONOSCOPY N/A 05/18/2016   Procedure: COLONOSCOPY;  Surgeon: Daneil Dolin, MD;  Location: [facility] ENDO SUITE;  Service: Endoscopy;  Laterality: N/A;  2:45pm   CYST EXCISION Right 03/01/2021   Procedure: EXCISION CYST, CHIN;  Surgeon: Virl Cagey, MD;  Location: [facility] ORS;  Service: General;  Laterality: Right;   DILATION AND CURETTAGE OF UTERUS     Dr Elonda Husky 3-4 yrs ago   EXCISION OF BACK LESION Right 03/01/2021   Procedure: EXCISION OF BACK CYST;  Surgeon:  Virl Cagey, MD;  Location: [facility] ORS;  Service: General;  Laterality: Right;   TUBAL LIGATION     Dr Wonda Olds     OB History   No obstetric history on file.     Family History  Problem Relation Age of Onset   Colon cancer Brother 49       Still alive, in his eighties   Anesthesia problems Neg Hx    Hypotension Neg Hx    Malignant hyperthermia Neg Hx    Pseudochol deficiency Neg Hx     Social History   Tobacco Use   Smoking status: Former    Packs/day: 0.50    Years: 10.00    Pack years: 5.00    Types: Cigarettes    Quit date: 08/07/1994    Years since quitting: 26.7   Smokeless tobacco: Never  Substance Use Topics   Alcohol use: No   Drug use: No    Home Medications Prior to Admission medications   Medication Sig Start Date End Date Taking? Authorizing Provider  doxycycline (VIBRAMYCIN) 100 MG capsule Take 1 capsule (100 mg total) by mouth 2 (two) times daily. One po bid x 7 days 04/24/21  Yes Daleen Bo, MD  acetaminophen (TYLENOL) 500 MG tablet Take 500 mg by mouth every 6 (six) hours as needed for moderate pain.    [provider]  aspirin EC 81 MG tablet Take 81 mg by mouth daily.    [provider]  atorvastatin (LIPITOR) 10 MG tablet Take 10 mg by mouth daily.    [provider]  Calcium Carbonate-Vitamin D 600-400 MG-UNIT tablet Take 1 tablet by mouth daily.    [provider]  cholecalciferol (VITAMIN D) 1000 units tablet Take 1,000 Units by mouth daily.    [provider]  docusate sodium (COLACE) 100 MG capsule Take 100 mg by mouth daily as needed for mild constipation.    [provider]  hydrochlorothiazide (HYDRODIURIL) 25 MG tablet Take 25 mg by mouth daily.    [provider]  ketotifen (ZADITOR) 0.025 % ophthalmic solution Place 1 drop into both eyes 2 (two) times daily as needed (allergies).    [provider]  losartan (COZAAR) 50 MG tablet Take 50 mg by mouth daily.     [provider]  ondansetron (ZOFRAN) 4 MG tablet Take 1 tablet (4 mg total) by mouth every 8 (eight) hours as needed. 03/01/21 03/01/22  Virl Cagey, MD  psyllium (METAMUCIL) 58.6 % powder Take 1 packet by mouth daily.    [provider]  traMADol (ULTRAM) 50 MG tablet Take 1 tablet (50 mg total) by mouth every 6 (six) hours as needed for severe pain. 03/01/21 03/01/22  Virl Cagey, MD  vitamin C (ASCORBIC ACID) 500 MG tablet Take 500 mg by mouth daily.    [provider]    Allergies    Penicillins, Codeine, and Levaquin [levofloxacin in d5w]  Review of Systems   Review of Systems  All other systems reviewed and are negative.  Physical Exam Updated Vital Signs BP (!) 142/67   Pulse 77   Temp 100.1 F (37.8 C)   Resp 20   Ht 5\' 2"  (1.575 m)   Wt 99.8 kg   SpO2 96%   BMI 40.24 kg/m   Physical Exam Vitals and nursing note reviewed.  Constitutional:      General: She is not in acute distress.    Appearance: She is well-developed. She is not ill-appearing or diaphoretic.  HENT:     Head: Normocephalic and atraumatic.     Right Ear: External ear normal.     Left Ear: External ear normal.  Eyes:     Conjunctiva/sclera: Conjunctivae normal.     Pupils: Pupils are equal, round, and reactive to light.  Neck:     Trachea: Phonation normal.  Cardiovascular:     Rate and Rhythm: Normal rate and regular rhythm.     Heart sounds: Normal heart sounds.  Pulmonary:     Effort: Pulmonary effort is normal. No respiratory distress.     Breath sounds: Rhonchi present. No rales.  Chest:     Chest wall: No tenderness.  Abdominal:     General: There is no distension.     Palpations: Abdomen is soft.     Tenderness: There is no abdominal tenderness.  Musculoskeletal:        General: Normal range of motion.     Cervical back: Normal range of motion and neck supple.  Skin:    General: Skin is warm and dry.  Neurological:     Mental Status: She  is alert and oriented to person, place, and time.     Cranial Nerves: No cranial nerve deficit.     Sensory: No sensory deficit.     Motor: No abnormal muscle tone.  Coordination: Coordination normal.  Psychiatric:        Mood and Affect: Mood normal.        Behavior: Behavior normal.        Thought Content: Thought content normal.        Judgment: Judgment normal.    ED Results / Procedures / Treatments   Labs (all labs ordered are listed, but only abnormal results are displayed) Labs Reviewed  RESP PANEL BY RT-PCR (FLU A&B, COVID) [id number]    EKG None  Radiology DG Chest Port 1 View  Result Date: 04/24/2021 CLINICAL DATA:  80 year old female with history of cough and congestion for 1 week with intermittent low-grade fever. EXAM: PORTABLE CHEST 1 VIEW COMPARISON:  Chest x-ray 07/24/2004. FINDINGS: Lung volumes are normal. No consolidative airspace disease. No pleural effusions. No pneumothorax. No pulmonary nodule or mass noted. Pulmonary vasculature is normal. Heart size is mildly enlarged. Upper mediastinal contours are within normal limits. Atherosclerosis in the thoracic aorta. IMPRESSION: 1.  No radiographic evidence of acute cardiopulmonary disease. 2. Aortic atherosclerosis. 3. Mild cardiomegaly. Electronically Signed   By: Vinnie Langton M.D.   On: 04/24/2021 08:01    Procedures Procedures   Medications Ordered in ED Medications - No data to display  ED Course  I have reviewed the triage vital signs and the nursing notes.  Pertinent labs & imaging results that were available during my care of the patient were reviewed by me and considered in my medical decision making (see chart for details).    MDM Rules/Calculators/A&P                            No data found.  At the time of disposition -reevaluation with update and discussion. After initial assessment and treatment, an updated evaluation reveals no further complaints.  Findings discussed and questions  answered. Daleen Bo   Medical Decision Making:  This patient is presenting for evaluation of cough with sputum production, which does require a range of treatment options, and is a complaint that involves a moderate risk of morbidity and mortality. The differential diagnoses include pneumonia, URI, COVID or flu infection. I decided to review old records, and in summary elderly female presenting with nonspecific symptoms.  I did not require additional historical information from anyone.  Clinical Laboratory Tests Ordered, included  viral panel . Review indicates normal. Radiologic Tests Ordered, included chest x-ray.  I independently Visualized: Radiographic images, which show no acute abnormalities   Critical Interventions-clinical evaluation, viral testing, chest x-ray, observation and reassessment  After These Interventions, the Patient was reevaluated and was found to have a discharge.  Patient with nonspecified bronchitis, and a non-smoker.  Vital signs are reassuring.  No indication for further ED intervention or hospitalization at this time.  She is candidate for outpatient management.  Will cover with oral antibiotics, and instructed on dietary and activity management.  CRITICAL CARE-no Performed by: Daleen Bo  Nursing Notes Reviewed/ Care Coordinated Applicable Imaging Reviewed Interpretation of Laboratory Data incorporated into ED treatment  The patient appears reasonably screened and/or stabilized for discharge and I doubt any other medical condition or other [facility] requiring further screening, evaluation, or treatment in the ED at this time prior to discharge.  Plan: Home Medications-continue usual medicines, antipyretic of choice as needed; Home Treatments-rest, fluids, gradual increase activity; return here if the recommended treatment, does not improve the symptoms; Recommended follow up-PCP, PR     Final Clinical Impression(s) /  ED Diagnoses Final diagnoses:   Acute bronchitis, unspecified organism    Rx / DC Orders ED Discharge Orders          Ordered    doxycycline (VIBRAMYCIN) 100 MG capsule  2 times daily        04/24/21 0806             Daleen Bo, MD 04/25/21 1515

## 2021-04-24 NOTE — ED Triage Notes (Signed)
Pt here from home with c/o cough for 1 week , temp 100.1 on arrival ,

## 2021-05-01 DIAGNOSIS — J069 Acute upper respiratory infection, unspecified: Secondary | ICD-10-CM | POA: Diagnosis not present

## 2021-05-12 DIAGNOSIS — I10 Essential (primary) hypertension: Secondary | ICD-10-CM | POA: Diagnosis not present

## 2021-05-12 DIAGNOSIS — E782 Mixed hyperlipidemia: Secondary | ICD-10-CM | POA: Diagnosis not present

## 2021-05-30 ENCOUNTER — Encounter: Payer: Self-pay | Admitting: *Deleted

## 2021-06-01 DIAGNOSIS — J069 Acute upper respiratory infection, unspecified: Secondary | ICD-10-CM | POA: Diagnosis not present

## 2021-06-03 DIAGNOSIS — J069 Acute upper respiratory infection, unspecified: Secondary | ICD-10-CM | POA: Diagnosis not present

## 2021-06-09 DIAGNOSIS — R7301 Impaired fasting glucose: Secondary | ICD-10-CM | POA: Diagnosis not present

## 2021-06-09 DIAGNOSIS — E782 Mixed hyperlipidemia: Secondary | ICD-10-CM | POA: Diagnosis not present

## 2021-06-11 DIAGNOSIS — E785 Hyperlipidemia, unspecified: Secondary | ICD-10-CM | POA: Diagnosis not present

## 2021-06-11 DIAGNOSIS — I10 Essential (primary) hypertension: Secondary | ICD-10-CM | POA: Diagnosis not present

## 2021-06-13 DIAGNOSIS — I10 Essential (primary) hypertension: Secondary | ICD-10-CM | POA: Diagnosis not present

## 2021-06-13 DIAGNOSIS — E785 Hyperlipidemia, unspecified: Secondary | ICD-10-CM | POA: Diagnosis not present

## 2021-06-14 DIAGNOSIS — K5909 Other constipation: Secondary | ICD-10-CM | POA: Diagnosis not present

## 2021-06-14 DIAGNOSIS — M5442 Lumbago with sciatica, left side: Secondary | ICD-10-CM | POA: Diagnosis not present

## 2021-06-14 DIAGNOSIS — R059 Cough, unspecified: Secondary | ICD-10-CM | POA: Diagnosis not present

## 2021-06-14 DIAGNOSIS — M8580 Other specified disorders of bone density and structure, unspecified site: Secondary | ICD-10-CM | POA: Diagnosis not present

## 2021-06-14 DIAGNOSIS — R7301 Impaired fasting glucose: Secondary | ICD-10-CM | POA: Diagnosis not present

## 2021-06-14 DIAGNOSIS — E782 Mixed hyperlipidemia: Secondary | ICD-10-CM | POA: Diagnosis not present

## 2021-06-14 DIAGNOSIS — I10 Essential (primary) hypertension: Secondary | ICD-10-CM | POA: Diagnosis not present

## 2021-06-14 DIAGNOSIS — R600 Localized edema: Secondary | ICD-10-CM | POA: Diagnosis not present

## 2021-06-14 DIAGNOSIS — M5441 Lumbago with sciatica, right side: Secondary | ICD-10-CM | POA: Diagnosis not present

## 2021-06-14 DIAGNOSIS — D649 Anemia, unspecified: Secondary | ICD-10-CM | POA: Diagnosis not present

## 2021-07-11 DIAGNOSIS — I10 Essential (primary) hypertension: Secondary | ICD-10-CM | POA: Diagnosis not present

## 2021-07-11 DIAGNOSIS — E119 Type 2 diabetes mellitus without complications: Secondary | ICD-10-CM | POA: Diagnosis not present

## 2021-08-11 DIAGNOSIS — E119 Type 2 diabetes mellitus without complications: Secondary | ICD-10-CM | POA: Diagnosis not present

## 2021-08-11 DIAGNOSIS — I10 Essential (primary) hypertension: Secondary | ICD-10-CM | POA: Diagnosis not present

## 2021-09-10 DIAGNOSIS — I10 Essential (primary) hypertension: Secondary | ICD-10-CM | POA: Diagnosis not present

## 2021-10-04 ENCOUNTER — Encounter: Payer: Self-pay | Admitting: Gastroenterology

## 2021-10-04 ENCOUNTER — Ambulatory Visit: Payer: Medicare Other | Admitting: Gastroenterology

## 2021-10-04 ENCOUNTER — Encounter: Payer: Self-pay | Admitting: *Deleted

## 2021-10-04 DIAGNOSIS — Z860100 Personal history of colon polyps, unspecified: Secondary | ICD-10-CM

## 2021-10-04 DIAGNOSIS — Z800 Family history of malignant neoplasm of digestive organs: Secondary | ICD-10-CM

## 2021-10-04 DIAGNOSIS — K5900 Constipation, unspecified: Secondary | ICD-10-CM | POA: Diagnosis not present

## 2021-10-04 MED ORDER — PEG 3350-KCL-NA BICARB-NACL 420 G PO SOLR: ORAL | 0 refills | Status: AC

## 2021-10-04 MED ORDER — PEG 3350-KCL-NA BICARB-NACL 420 G PO SOLR: ORAL | 0 refills | Status: DC

## 2021-10-04 NOTE — Patient Instructions (Signed)
Let's increase Metamucil to twice a day. Give this about a week.   If no improvement, continue Metamucil but add Miralax 1 capful in 8 ounces of water on any given day that you do NOT have a bowel movement.  We are arranging a colonoscopy in the near future!  We will see you in 6 months!  It was a pleasure to see you today. I want to create trusting relationships with patients to provide genuine, compassionate, and quality care. I value your feedback. If you receive a survey regarding your visit,  I greatly appreciate you taking time to fill this out.   Annitta Needs, PhD, ANP-BC [facility] Gastroenterology

## 2021-10-04 NOTE — Addendum Note (Signed)
Addended by: Cheron Every on: 10/04/2021 01:53 PM   Modules accepted: Orders

## 2021-10-04 NOTE — Progress Notes (Signed)
Gastroenterology Office Note    Referring Provider: Celene Squibb, MD Primary Care Physician:  Celene Squibb, MD  Primary GI: Dr. Gala Romney   Chief Complaint   Chief Complaint  Patient presents with   Constipation    Constipation with straining x a year to have a soft stool     History of Present Illness   Phyllis Santos is a 81 y.o. female presenting today to arrange surveillance colonoscopy. She was brought in due to constipation. Last colonoscopy in 2018 with adenomas. Surveillance due now.   Takes Metamucil daily. Takes 1 tablespoon daily. Takes 1 colace at night. Still not the best outcome. Better this week. Has had more issues with constipation 6 months to 1 year. No rectal bleeding. Sometimes feels like rectum doesn't open up like it should but this week this week has been "normal". Sometimes itching afterwards. No unexplained weight loss or lack of appetite.   Will go regular for 4-5 days then won't go for 2 days.   Past Medical History:  Diagnosis Date   Hypercholesterolemia    Hypertension     Past Surgical History:  Procedure Laterality Date   APPENDECTOMY     Dr Wonda Olds   CARPAL TUNNEL RELEASE     right-Dr Luna Glasgow   CATARACT EXTRACTION [facility]  08/16/2011   Procedure: CATARACT EXTRACTION PHACO AND INTRAOCULAR LENS PLACEMENT ([place]);  Surgeon: Tonny Branch, MD;  Location: [facility] ORS;  Service: Ophthalmology;  Laterality: Left;  CDE: 12.45   CATARACT EXTRACTION W/PHACO  09/03/2011   Procedure: CATARACT EXTRACTION PHACO AND INTRAOCULAR LENS PLACEMENT ([facility]);  Surgeon: Tonny Branch, MD;  Location: [facility] ORS;  Service: Ophthalmology;  Laterality: Right;  CDE:13.47   CHOLECYSTECTOMY     [facility]   COLONOSCOPY N/A 05/18/2016   one 7 mm polyp s/p removal, ond 5 mm polyp from rectum, one polyp from transverse colon. Diverticulosis in sigmoid, descending, and transverse colon. Tubular adenomas and hyperplastic.   CYST EXCISION Right 03/01/2021   Procedure: EXCISION CYST,  CHIN;  Surgeon: Virl Cagey, MD;  Location: [facility] ORS;  Service: General;  Laterality: Right;   DILATION AND CURETTAGE OF UTERUS     Dr Elonda Husky 3-4 yrs ago   EXCISION OF BACK LESION Right 03/01/2021   Procedure: EXCISION OF BACK CYST;  Surgeon: Virl Cagey, MD;  Location: [facility] ORS;  Service: General;  Laterality: Right;   TUBAL LIGATION     Dr Wonda Olds    Current Outpatient Medications  Medication Sig Dispense Refill   acetaminophen (TYLENOL) 500 MG tablet Take 500 mg by mouth every 6 (six) hours as needed for moderate pain.     aspirin EC 81 MG tablet Take 81 mg by mouth daily.     atorvastatin (LIPITOR) 10 MG tablet Take 10 mg by mouth daily.     Calcium Carbonate-Vitamin D 600-400 MG-UNIT tablet Take 1 tablet by mouth daily.     cholecalciferol (VITAMIN D) 1000 units tablet Take 1,000 Units by mouth daily.     hydrochlorothiazide (HYDRODIURIL) 25 MG tablet Take 25 mg by mouth daily.     ketotifen (ZADITOR) 0.025 % ophthalmic solution Place 1 drop into both eyes 2 (two) times daily as needed (allergies).     losartan (COZAAR) 50 MG tablet Take 50 mg by mouth daily.     psyllium (METAMUCIL) 58.6 % powder Take 1 packet by mouth daily.     vitamin C (ASCORBIC ACID) 500 MG tablet Take 500 mg by  mouth daily.     docusate sodium (COLACE) 100 MG capsule Take 100 mg by mouth daily as needed for mild constipation. (Patient not taking: Reported on 10/04/2021)     doxycycline (VIBRAMYCIN) 100 MG capsule Take 1 capsule (100 mg total) by mouth 2 (two) times daily. One po bid x 7 days (Patient not taking: Reported on 10/04/2021) 14 capsule 0   ondansetron (ZOFRAN) 4 MG tablet Take 1 tablet (4 mg total) by mouth every 8 (eight) hours as needed. (Patient not taking: Reported on 10/04/2021) 30 tablet 1   traMADol (ULTRAM) 50 MG tablet Take 1 tablet (50 mg total) by mouth every 6 (six) hours as needed for severe pain. (Patient not taking: Reported on 10/04/2021) 15 tablet 0   No current  facility-administered medications for this visit.    Allergies as of 10/04/2021 - Review Complete 10/04/2021  Allergen Reaction Noted   Penicillins Anaphylaxis and Rash 08/07/2011   Codeine Other (See Comments) 08/07/2011   Levaquin [levofloxacin in d5w] Other (See Comments) 08/16/2011    Family History  Problem Relation Age of Onset   Colon cancer Brother 80       Still alive, in his eighties   Anesthesia problems Neg Hx    Hypotension Neg Hx    Malignant hyperthermia Neg Hx    Pseudochol deficiency Neg Hx     Social History   Socioeconomic History   Marital status: Married    Spouse name: Not on file   Number of children: Not on file   Years of education: Not on file   Highest education level: Not on file  Occupational History   Not on file  Tobacco Use   Smoking status: Former    Packs/day: 0.50    Years: 10.00    Pack years: 5.00    Types: Cigarettes    Quit date: 08/07/1994    Years since quitting: 27.1   Smokeless tobacco: Never  Substance and Sexual Activity   Alcohol use: No   Drug use: No   Sexual activity: Yes    Birth control/protection: Post-menopausal  Other Topics Concern   Not on file  Social History Narrative   Not on file   Social Determinants of Health   Financial Resource Strain: Not on file  Food Insecurity: Not on file  Transportation Needs: Not on file  Physical Activity: Not on file  Stress: Not on file  Social Connections: Not on file  Intimate Partner Violence: Not on file     Review of Systems   Gen: Denies any fever, chills, fatigue, weight loss, lack of appetite.  CV: Denies chest pain, heart palpitations, peripheral edema, syncope.  Resp: Denies shortness of breath at rest or with exertion. Denies wheezing or cough.  GI: see HPI GU : Denies urinary burning, urinary frequency, urinary hesitancy MS: Denies joint pain, muscle weakness, cramps, or limitation of movement.  Derm: Denies rash, itching, dry skin Psych: Denies  depression, anxiety, memory loss, and confusion Heme: Denies bruising, bleeding, and enlarged lymph nodes.   Physical Exam   BP (!) 122/58   Pulse 63   Temp (!) 97.3 F (36.3 C)   Ht '5\' 2"'$  (1.575 m)   Wt 205 lb 12.8 oz (93.4 kg)   BMI 37.64 kg/m  General:   Alert and oriented. Pleasant and cooperative. Well-nourished and well-developed.  Head:  Normocephalic and atraumatic. Eyes:  Without icterus Ears:  Normal auditory acuity. Lungs:  Clear to auscultation bilaterally.  Heart:  S1,  S2 present without murmurs appreciated.  Abdomen:  +BS, soft, non-tender and non-distended. No HSM noted. No guarding or rebound. No masses appreciated.  Rectal:  no external hemorrhoid tags. DRE without mass. Prominence of left lateral column noted.  Msk:  Symmetrical without gross deformities. Normal posture. Extremities:  Without edema. Neurologic:  Alert and  oriented x4;  grossly normal neurologically. Skin:  Intact without significant lesions or rashes. Psych:  Alert and cooperative. Normal mood and affect.   Assessment   Phyllis Santos is a 81 y.o. female presenting today with need for surveillance colonoscopy and reported mild constipation.  Constipation has responded decently to Metamucil but could be maximized. Today, she notes improvement over the past week with symptoms. Will increase Metamucil to BID and add Miralax on any given day no BM.   She is in good health and desires surveillance colonoscopy at this time.   PLAN   Proceed with colonoscopy by Dr. Gala Romney in near future: the risks, benefits, and alternatives have been discussed with the patient in detail. The patient states understanding and desires to proceed.  Metamucil BID  Add Miralax on any given day no BM  Return in 6 months  Annitta Needs, PhD, [facility] Gastroenterology

## 2021-10-11 DIAGNOSIS — I10 Essential (primary) hypertension: Secondary | ICD-10-CM | POA: Diagnosis not present

## 2021-10-23 ENCOUNTER — Other Ambulatory Visit (HOSPITAL_COMMUNITY)
Admission: RE | Admit: 2021-10-23 | Discharge: 2021-10-23 | Disposition: A | Discharge status: Home / Self Care | Payer: Medicare Other | Source: Ambulatory Visit | Attending: Internal Medicine | Admitting: Internal Medicine

## 2021-10-23 DIAGNOSIS — Z860100 Personal history of colon polyps, unspecified: Secondary | ICD-10-CM

## 2021-10-23 DIAGNOSIS — Z800 Family history of malignant neoplasm of digestive organs: Secondary | ICD-10-CM

## 2021-10-23 LAB — BASIC METABOLIC PANEL
Anion gap: 5 (ref 5–15)
BUN: 11 mg/dL (ref 8–23)
CO2: 31 mmol/L (ref 22–32)
Calcium: 9.5 mg/dL (ref 8.9–10.3)
Chloride: 104 mmol/L (ref 98–111)
Creatinine, Ser: 0.58 mg/dL (ref 0.44–1.00)
GFR, Estimated: >60 mL/min (ref >60)
Glucose, Bld: 95 mg/dL (ref 70–99)
Potassium: 3.6 mmol/L (ref 3.5–5.1)
Sodium: 140 mmol/L (ref 135–145)

## 2021-10-25 DIAGNOSIS — H35373 Puckering of macula, bilateral: Secondary | ICD-10-CM | POA: Diagnosis not present

## 2021-10-25 DIAGNOSIS — H43813 Vitreous degeneration, bilateral: Secondary | ICD-10-CM | POA: Diagnosis not present

## 2021-10-25 DIAGNOSIS — Z961 Presence of intraocular lens: Secondary | ICD-10-CM | POA: Diagnosis not present

## 2021-10-30 ENCOUNTER — Telehealth: Payer: Self-pay | Admitting: *Deleted

## 2021-10-30 NOTE — Telephone Encounter (Signed)
Spoke to pt, she informed me that she did not receive her instructions for her colonoscopy. I informed her that they were sent out on 10/04/21.  I went over instructions with her. Informed her to call office if she has any problems.

## 2021-10-31 ENCOUNTER — Telehealth: Payer: Self-pay

## 2021-10-31 NOTE — Telephone Encounter (Signed)
error 

## 2021-10-31 NOTE — Telephone Encounter (Signed)
Spoke to pt, informed her of instructions on prep for colonoscopy. Pt voiced understanding.

## 2021-10-31 NOTE — Telephone Encounter (Signed)
Pt called started drinking her procedure meds an hour before time. Please advise

## 2021-11-01 ENCOUNTER — Other Ambulatory Visit: Payer: Self-pay

## 2021-11-01 ENCOUNTER — Ambulatory Visit (HOSPITAL_COMMUNITY): Payer: Medicare Other | Admitting: Anesthesiology

## 2021-11-01 ENCOUNTER — Encounter (HOSPITAL_COMMUNITY)
Admission: RE | Disposition: A | Discharge status: Home / Self Care | Payer: Self-pay | Source: Ambulatory Visit | Attending: Internal Medicine

## 2021-11-01 ENCOUNTER — Encounter (HOSPITAL_COMMUNITY): Payer: Self-pay | Admitting: Internal Medicine

## 2021-11-01 ENCOUNTER — Ambulatory Visit (HOSPITAL_COMMUNITY)
Admission: RE | Admit: 2021-11-01 | Discharge: 2021-11-01 | Disposition: A | Discharge status: Home / Self Care | Payer: Medicare Other | Source: Ambulatory Visit | Attending: Internal Medicine | Admitting: Internal Medicine

## 2021-11-01 ENCOUNTER — Ambulatory Visit (HOSPITAL_BASED_OUTPATIENT_CLINIC_OR_DEPARTMENT_OTHER): Payer: Medicare Other | Admitting: Anesthesiology

## 2021-11-01 DIAGNOSIS — Z09 Encounter for follow-up examination after completed treatment for conditions other than malignant neoplasm: Secondary | ICD-10-CM

## 2021-11-01 DIAGNOSIS — K5730 Diverticulosis of large intestine without perforation or abscess without bleeding: Secondary | ICD-10-CM | POA: Diagnosis not present

## 2021-11-01 DIAGNOSIS — Z1211 Encounter for screening for malignant neoplasm of colon: Secondary | ICD-10-CM | POA: Diagnosis not present

## 2021-11-01 DIAGNOSIS — Z87891 Personal history of nicotine dependence: Secondary | ICD-10-CM | POA: Insufficient documentation

## 2021-11-01 DIAGNOSIS — Z800 Family history of malignant neoplasm of digestive organs: Secondary | ICD-10-CM

## 2021-11-01 DIAGNOSIS — I10 Essential (primary) hypertension: Secondary | ICD-10-CM | POA: Diagnosis not present

## 2021-11-01 DIAGNOSIS — Z860100 Personal history of colon polyps, unspecified: Secondary | ICD-10-CM

## 2021-11-01 DIAGNOSIS — D2330 Other benign neoplasm of skin of unspecified part of face: Secondary | ICD-10-CM

## 2021-11-01 DIAGNOSIS — L723 Sebaceous cyst: Secondary | ICD-10-CM

## 2021-11-01 DIAGNOSIS — K5900 Constipation, unspecified: Secondary | ICD-10-CM

## 2021-11-01 DIAGNOSIS — R195 Other fecal abnormalities: Secondary | ICD-10-CM

## 2021-11-01 HISTORY — PX: COLONOSCOPY WITH PROPOFOL: SHX5780

## 2021-11-01 SURGERY — COLONOSCOPY WITH PROPOFOL
Anesthesia: General

## 2021-11-01 MED ORDER — PROPOFOL 10 MG/ML IV BOLUS
INTRAVENOUS | Status: DC | PRN
  Administered 2021-11-01: 60 mg via INTRAVENOUS

## 2021-11-01 MED ORDER — PROPOFOL 500 MG/50ML IV EMUL
INTRAVENOUS | Status: DC | PRN
  Administered 2021-11-01: 125 ug/kg/min via INTRAVENOUS

## 2021-11-01 MED ORDER — LACTATED RINGERS IV SOLN: INTRAVENOUS | Status: DC

## 2021-11-01 NOTE — H&P (Signed)
$'@LOGO'j$ @   Primary Care Physician:  Celene Squibb, MD Primary Gastroenterologist:  Dr. Gala Romney  Pre-Procedure History & Physical: HPI:  Phyllis Santos is a 81 y.o. female here for maintenance colonoscopy.  History of multiple colonic adenomas removed 5 years ago.  Past Medical History:  Diagnosis Date   Hypercholesterolemia    Hypertension     Past Surgical History:  Procedure Laterality Date   APPENDECTOMY     Dr Wonda Olds   CARPAL TUNNEL RELEASE     right-Dr Luna Glasgow   CATARACT EXTRACTION [facility]  08/16/2011   Procedure: CATARACT EXTRACTION PHACO AND INTRAOCULAR LENS PLACEMENT ([place]);  Surgeon: Tonny Branch, MD;  Location: [facility];  Service: Ophthalmology;  Laterality: Left;  CDE: 12.45   CATARACT EXTRACTION W/PHACO  09/03/2011   Procedure: CATARACT EXTRACTION PHACO AND INTRAOCULAR LENS PLACEMENT (IOC);  Surgeon: Tonny Branch, MD;  Location: [facility];  Service: Ophthalmology;  Laterality: Right;  CDE:13.47   CHOLECYSTECTOMY     [facility]   COLONOSCOPY N/A 05/18/2016   one 7 mm polyp s/p removal, ond 5 mm polyp from rectum, one polyp from transverse colon. Diverticulosis in sigmoid, descending, and transverse colon. Tubular adenomas and hyperplastic.   CYST EXCISION Right 03/01/2021   Procedure: EXCISION CYST, CHIN;  Surgeon: Virl Cagey, MD;  Location: [facility];  Service: General;  Laterality: Right;   DILATION AND CURETTAGE OF UTERUS     Dr Elonda Husky 3-4 yrs ago   EXCISION OF BACK LESION Right 03/01/2021   Procedure: EXCISION OF BACK CYST;  Surgeon: Virl Cagey, MD;  Location: [facility];  Service: General;  Laterality: Right;   TUBAL LIGATION     Dr Wonda Olds    Prior to Admission medications   Medication Sig Start Date End Date Taking? Authorizing Provider  aspirin EC 81 MG tablet Take 81 mg by mouth daily.   Yes [provider]  atorvastatin (LIPITOR) 10 MG tablet Take 10 mg by mouth daily.   Yes [provider]  Calcium Carbonate-Vitamin D 600-400 MG-UNIT  tablet Take 1 tablet by mouth daily.   Yes [provider]  Cholecalciferol (VITAMIN D) 125 MCG (5000 UT) CAPS Take 5,000 Units by mouth daily.   Yes [provider]  Homeopathic Products [facility] ALLERGY EYE RELIEF OP) Place 1 drop into both eyes daily as needed (watery eyes).   Yes [provider]  hydrochlorothiazide (HYDRODIURIL) 25 MG tablet Take 25 mg by mouth daily.   Yes [provider]  losartan (COZAAR) 100 MG tablet Take 100 mg by mouth daily.   Yes [provider]  polyethylene glycol-electrolytes (NULYTELY) 420 g solution As directed 10/04/21  Yes Mick Tanguma, Cristopher Estimable, MD  psyllium (METAMUCIL) 58.6 % powder Take 1 packet by mouth daily. 5 ml   Yes [provider]  sennosides-docusate sodium (SENOKOT-S) 8.6-50 MG tablet Take 1 tablet by mouth at bedtime.   Yes [provider]  vitamin C (ASCORBIC ACID) 500 MG tablet Take 500 mg by mouth daily.   Yes [provider]  acetaminophen (TYLENOL) 500 MG tablet Take 500 mg by mouth every 6 (six) hours as needed for moderate pain.    [provider]  polyethylene glycol-electrolytes (NULYTELY) 420 g solution As directed 10/04/21   Daneil Dolin, MD    Allergies as of 10/04/2021 - Review Complete 10/04/2021  Allergen Reaction Noted   Penicillins Anaphylaxis and Rash 08/07/2011   Codeine Other (See Comments) 08/07/2011   Levaquin [levofloxacin in d5w] Other (See  Comments) 08/16/2011    Family History  Problem Relation Age of Onset   Colon cancer Brother 88       Still alive, in his eighties   Anesthesia problems Neg Hx    Hypotension Neg Hx    Malignant hyperthermia Neg Hx    Pseudochol deficiency Neg Hx     Social History   Socioeconomic History   Marital status: Married    Spouse name: Not on file   Number of children: Not on file   Years of education: Not on file   Highest education level: Not on file  Occupational History   Not on file  Tobacco  Use   Smoking status: Former    Packs/day: 0.50    Years: 10.00    Total pack years: 5.00    Types: Cigarettes    Quit date: 08/07/1994    Years since quitting: 27.2   Smokeless tobacco: Never  Substance and Sexual Activity   Alcohol use: No   Drug use: No   Sexual activity: Yes    Birth control/protection: Post-menopausal  Other Topics Concern   Not on file  Social History Narrative   Not on file   Social Determinants of Health   Financial Resource Strain: Not on file  Food Insecurity: Not on file  Transportation Needs: Not on file  Physical Activity: Not on file  Stress: Not on file  Social Connections: Not on file  Intimate Partner Violence: Not on file    Review of Systems: See HPI, otherwise negative ROS  Physical Exam: BP (!) 132/54   Pulse 61   Temp 98.2 F (36.8 C) (Oral)   Resp 15   Ht '5\' 2"'$  (1.575 m)   Wt 95.3 kg   SpO2 97%   BMI 38.41 kg/m  General:   Alert,  Well-developed, well-nourished, pleasant and cooperative in NAD SNeck:  Supple; no masses or thyromegaly. No significant cervical adenopathy. Lungs:  Clear throughout to auscultation.   No wheezes, crackles, or rhonchi. No acute distress. Heart:  Regular rate and rhythm; no murmurs, clicks, rubs,  or gallops. Abdomen: Non-distended, normal bowel sounds.  Soft and nontender without appreciable mass or hepatosplenomegaly.  Pulses:  Normal pulses noted. Extremities:  Without clubbing or edema.  Impression/Plan: 81 year old lady with history of colonic adenomas.  Here for 1 more surveillance examination.  I have offered her surveillance colonoscopy today. The risks, benefits, limitations, alternatives and imponderables have been reviewed with the patient. Questions have been answered. All parties are agreeable.       Notice: This dictation was prepared with Dragon dictation along with smaller phrase technology. Any transcriptional errors that result from this process are unintentional and may not  be corrected upon review.

## 2021-11-01 NOTE — Discharge Instructions (Signed)
  Colonoscopy Discharge Instructions  Read the instructions outlined below and refer to this sheet in the next few weeks. These discharge instructions provide you with general information on caring for yourself after you leave the hospital. Your doctor may also give you specific instructions. While your treatment has been planned according to the most current medical practices available, unavoidable complications occasionally occur. If you have any problems or questions after discharge, call Dr. Gala Romney at [phone]. ACTIVITY You may resume your regular activity, but move at a slower pace for the next 24 hours.  Take frequent rest periods for the next 24 hours.  Walking will help get rid of the air and reduce the bloated feeling in your belly (abdomen).  No driving for 24 hours (because of the medicine (anesthesia) used during the test).   Do not sign any important legal documents or operate any machinery for 24 hours (because of the anesthesia used during the test).  NUTRITION Drink plenty of fluids.  You may resume your normal diet as instructed by your doctor.  Begin with a light meal and progress to your normal diet. Heavy or fried foods are harder to digest and may make you feel sick to your stomach (nauseated).  Avoid alcoholic beverages for 24 hours or as instructed.  MEDICATIONS You may resume your normal medications unless your doctor tells you otherwise.  WHAT YOU CAN EXPECT TODAY Some feelings of bloating in the abdomen.  Passage of more gas than usual.  Spotting of blood in your stool or on the toilet paper.  IF YOU HAD POLYPS REMOVED DURING THE COLONOSCOPY: No aspirin products for 7 days or as instructed.  No alcohol for 7 days or as instructed.  Eat a soft diet for the next 24 hours.  FINDING OUT THE RESULTS OF YOUR TEST Not all test results are available during your visit. If your test results are not back during the visit, make an appointment with your caregiver to find out the  results. Do not assume everything is normal if you have not heard from your caregiver or the medical facility. It is important for you to follow up on all of your test results.  SEEK IMMEDIATE MEDICAL ATTENTION IF: You have more than a spotting of blood in your stool.  Your belly is swollen (abdominal distention).  You are nauseated or vomiting.  You have a temperature over 101.  You have abdominal pain or discomfort that is severe or gets worse throughout the day.    Diverticulosis only found today.  No polyps.  A future colonoscopy is not recommended unless new symptoms develop  At patient request, I called [phone] -reviewed findings and recommendations

## 2021-11-01 NOTE — Anesthesia Preprocedure Evaluation (Addendum)
Anesthesia Evaluation  Patient identified by MRN, date of birth, ID band Patient awake    Reviewed: Allergy & Precautions, NPO status , Patient's Chart, lab work & pertinent test results  Airway Mallampati: II  TM Distance: >3 FB Neck ROM: Full    Dental  (+) Upper Dentures, Lower Dentures, Dental Advisory Given   Pulmonary former smoker,    Pulmonary exam normal breath sounds clear to auscultation       Cardiovascular Exercise Tolerance: Good hypertension, Pt. on medications Normal cardiovascular exam Rhythm:Regular Rate:Normal     Neuro/Psych negative neurological ROS  negative psych ROS   GI/Hepatic negative GI ROS, Neg liver ROS,   Endo/Other  negative endocrine ROS  Renal/GU negative Renal ROS  negative genitourinary   Musculoskeletal negative musculoskeletal ROS (+)   Abdominal   Peds negative pediatric ROS (+)  Hematology negative hematology ROS (+)   Anesthesia Other Findings   Reproductive/Obstetrics negative OB ROS                            Anesthesia Physical Anesthesia Plan  ASA: 2  Anesthesia Plan: General   Post-op Pain Management: Minimal or no pain anticipated   Induction: Intravenous  PONV Risk Score and Plan: Propofol infusion  Airway Management Planned: Nasal Cannula and Natural Airway  Additional Equipment:   Intra-op Plan:   Post-operative Plan:   Informed Consent: I have reviewed the patients History and Physical, chart, labs and discussed the procedure including the risks, benefits and alternatives for the proposed anesthesia with the patient or authorized representative who has indicated his/her understanding and acceptance.     Dental advisory given  Plan Discussed with: CRNA and Surgeon  Anesthesia Plan Comments:        Anesthesia Quick Evaluation

## 2021-11-01 NOTE — Op Note (Signed)
[facility] Patient Name: Phyllis Santos Procedure Date: 11/01/2021 1:50 PM MRN: [id number] Date of Birth: 1940-08-24 Attending MD: Norvel Richards , MD CSN: [id number] Age: 81 Admit Type: Outpatient Procedure:                Colonoscopy Indications:              High risk colon cancer surveillance: Personal                            history of colonic polyps Providers:                Norvel Richards, MD, Lurline Del, RN, Everardo Pacific, Randa Spike, Technician Referring MD:              Medicines:                Propofol per Anesthesia Complications:            No immediate complications. Estimated Blood Loss:     Estimated blood loss: none. Procedure:                Pre-Anesthesia Assessment:                           - Prior to the procedure, a History and Physical                            was performed, and patient medications and                            allergies were reviewed. The patient's tolerance of                            previous anesthesia was also reviewed. The risks                            and benefits of the procedure and the sedation                            options and risks were discussed with the patient.                            All questions were answered, and informed consent                            was obtained. Prior Anticoagulants: The patient has                            taken no previous anticoagulant or antiplatelet                            agents. ASA Grade Assessment: II - A patient with  mild systemic disease. After reviewing the risks                            and benefits, the patient was deemed in                            satisfactory condition to undergo the procedure.                           After obtaining informed consent, the colonoscope                            was passed under direct vision. Throughout the                             procedure, the patient's blood pressure, pulse, and                            oxygen saturations were monitored continuously. The                            [id number]) scope was introduced through                            the anus and advanced to the the cecum, identified                            by appendiceal orifice and ileocecal valve. The                            colonoscopy was performed without difficulty. The                            patient tolerated the procedure well. The quality                            of the bowel preparation was adequate. Scope In: 2:16:57 PM Scope Out: 2:30:02 PM Scope Withdrawal Time: 0 hours 6 minutes 27 seconds  Total Procedure Duration: 0 hours 13 minutes 5 seconds  Findings:      The perianal and digital rectal examinations were normal. Anal papilla       present.      Multiple small and large-mouthed diverticula were found in the entire       colon.      The exam was otherwise without abnormality on direct and retroflexion       views. Impression:               - Diverticulosis in the entire examined colon.                           - The examination was otherwise normal on direct                            and retroflexion views.                           -  No specimens collected. Moderate Sedation:      Moderate (conscious) sedation was personally administered by an       anesthesia professional. The following parameters were monitored: oxygen       saturation, heart rate, blood pressure, respiratory rate, EKG, adequacy       of pulmonary ventilation, and response to care. Recommendation:           - Patient has a contact number available for                            emergencies. The signs and symptoms of potential                            delayed complications were discussed with the                            patient. Return to normal activities tomorrow.                            Written discharge instructions were  provided to the                            patient.                           - Resume previous diet.                           - Continue present medications.                           - No repeat colonoscopy due to age.                           - Return to GI clinic PRN. Procedure Code(s):        --- Professional ---                           [phone], Colonoscopy, flexible; diagnostic, including                            collection of specimen(s) by brushing or washing,                            when performed (separate procedure) Diagnosis Code(s):        --- Professional ---                           Z86.010, Personal history of colonic polyps                           K57.30, Diverticulosis of large intestine without                            perforation or abscess without bleeding CPT copyright 2019 [employer]. All rights reserved. The codes documented in this report are preliminary  and upon coder review may  be revised to meet current compliance requirements. Cristopher Estimable. Dreshaun Stene, MD Norvel Richards, MD 11/01/2021 2:43:59 PM This report has been signed electronically. Number of Addenda: 0

## 2021-11-01 NOTE — Transfer of Care (Signed)
Immediate Anesthesia Transfer of Care Note  Patient: Phyllis Santos  Procedure(s) Performed: COLONOSCOPY WITH PROPOFOL  Patient Location: PACU  Anesthesia Type:General  Level of Consciousness: awake, alert  and oriented  Airway & Oxygen Therapy: Patient Spontanous Breathing  Post-op Assessment: Report given to RN, Post -op Vital signs reviewed and stable, Patient moving all extremities X 4 and Patient able to stick tongue midline  Post vital signs: Reviewed  Last Vitals:  Vitals Value Taken Time  BP 108/57   Temp 97.6   Pulse 71   Resp 16   SpO2 98     Last Pain:  Vitals:   11/01/21 1414  TempSrc:   PainSc: 0-No pain      Patients Stated Pain Goal: 7 (76/16/07 3710)  Complications: No notable events documented.

## 2021-11-01 NOTE — Anesthesia Postprocedure Evaluation (Signed)
Anesthesia Post Note  Patient: Phyllis Santos  Procedure(s) Performed: COLONOSCOPY WITH PROPOFOL  Patient location during evaluation: PACU Anesthesia Type: General Level of consciousness: awake and alert and oriented Pain management: pain level controlled Vital Signs Assessment: post-procedure vital signs reviewed and stable Respiratory status: spontaneous breathing, nonlabored ventilation and respiratory function stable Cardiovascular status: blood pressure returned to baseline and stable Postop Assessment: no apparent nausea or vomiting Anesthetic complications: no   No notable events documented.   Last Vitals:  Vitals:   11/01/21 1210 11/01/21 1433  BP: (!) 132/54 (!) 108/57  Pulse: 61 71  Resp: 15 17  Temp: 36.8 C 36.7 C  SpO2: 97% 97%    Last Pain:  Vitals:   11/01/21 1433  TempSrc: Oral  PainSc: 0-No pain                 [name]

## 2021-11-08 ENCOUNTER — Encounter (HOSPITAL_COMMUNITY): Payer: Self-pay | Admitting: Internal Medicine

## 2021-11-10 DIAGNOSIS — I10 Essential (primary) hypertension: Secondary | ICD-10-CM | POA: Diagnosis not present

## 2021-12-05 DIAGNOSIS — R7301 Impaired fasting glucose: Secondary | ICD-10-CM | POA: Diagnosis not present

## 2021-12-05 DIAGNOSIS — D649 Anemia, unspecified: Secondary | ICD-10-CM | POA: Diagnosis not present

## 2021-12-05 DIAGNOSIS — E782 Mixed hyperlipidemia: Secondary | ICD-10-CM | POA: Diagnosis not present

## 2021-12-10 DIAGNOSIS — I10 Essential (primary) hypertension: Secondary | ICD-10-CM | POA: Diagnosis not present

## 2021-12-11 DIAGNOSIS — M5442 Lumbago with sciatica, left side: Secondary | ICD-10-CM | POA: Diagnosis not present

## 2021-12-11 DIAGNOSIS — D649 Anemia, unspecified: Secondary | ICD-10-CM | POA: Diagnosis not present

## 2021-12-11 DIAGNOSIS — M5441 Lumbago with sciatica, right side: Secondary | ICD-10-CM | POA: Diagnosis not present

## 2021-12-11 DIAGNOSIS — R7301 Impaired fasting glucose: Secondary | ICD-10-CM | POA: Diagnosis not present

## 2021-12-11 DIAGNOSIS — K5909 Other constipation: Secondary | ICD-10-CM | POA: Diagnosis not present

## 2021-12-11 DIAGNOSIS — E782 Mixed hyperlipidemia: Secondary | ICD-10-CM | POA: Diagnosis not present

## 2021-12-11 DIAGNOSIS — M8580 Other specified disorders of bone density and structure, unspecified site: Secondary | ICD-10-CM | POA: Diagnosis not present

## 2021-12-11 DIAGNOSIS — R600 Localized edema: Secondary | ICD-10-CM | POA: Diagnosis not present

## 2021-12-11 DIAGNOSIS — R059 Cough, unspecified: Secondary | ICD-10-CM | POA: Diagnosis not present

## 2021-12-11 DIAGNOSIS — I10 Essential (primary) hypertension: Secondary | ICD-10-CM | POA: Diagnosis not present

## 2022-02-27 ENCOUNTER — Other Ambulatory Visit (HOSPITAL_COMMUNITY): Payer: Self-pay | Admitting: Internal Medicine

## 2022-02-27 DIAGNOSIS — Z1231 Encounter for screening mammogram for malignant neoplasm of breast: Secondary | ICD-10-CM

## 2022-03-13 ENCOUNTER — Encounter: Payer: Self-pay | Admitting: *Deleted

## 2022-03-26 ENCOUNTER — Encounter (HOSPITAL_COMMUNITY): Payer: Self-pay

## 2022-03-26 ENCOUNTER — Emergency Department (HOSPITAL_COMMUNITY)
Admission: EM | Admit: 2022-03-26 | Discharge: 2022-03-26 | Disposition: A | Discharge status: Home / Self Care | Payer: Medicare Other | Attending: Emergency Medicine | Admitting: Emergency Medicine

## 2022-03-26 ENCOUNTER — Emergency Department (HOSPITAL_COMMUNITY): Payer: Medicare Other

## 2022-03-26 ENCOUNTER — Other Ambulatory Visit: Payer: Self-pay

## 2022-03-26 DIAGNOSIS — I10 Essential (primary) hypertension: Secondary | ICD-10-CM | POA: Insufficient documentation

## 2022-03-26 DIAGNOSIS — Z79899 Other long term (current) drug therapy: Secondary | ICD-10-CM | POA: Insufficient documentation

## 2022-03-26 DIAGNOSIS — Y92007 Garden or yard of unspecified non-institutional (private) residence as the place of occurrence of the external cause: Secondary | ICD-10-CM | POA: Insufficient documentation

## 2022-03-26 DIAGNOSIS — W14XXXA Fall from tree, initial encounter: Secondary | ICD-10-CM | POA: Insufficient documentation

## 2022-03-26 DIAGNOSIS — Y9389 Activity, other specified: Secondary | ICD-10-CM | POA: Insufficient documentation

## 2022-03-26 DIAGNOSIS — S6991XA Unspecified injury of right wrist, hand and finger(s), initial encounter: Secondary | ICD-10-CM | POA: Diagnosis present

## 2022-03-26 DIAGNOSIS — M7989 Other specified soft tissue disorders: Secondary | ICD-10-CM | POA: Insufficient documentation

## 2022-03-26 DIAGNOSIS — S52571A Other intraarticular fracture of lower end of right radius, initial encounter for closed fracture: Secondary | ICD-10-CM | POA: Diagnosis not present

## 2022-03-26 DIAGNOSIS — Z7982 Long term (current) use of aspirin: Secondary | ICD-10-CM | POA: Diagnosis not present

## 2022-03-26 MED ORDER — HYDROCODONE-ACETAMINOPHEN 5-325 MG PO TABS: 0.5000 | ORAL_TABLET | ORAL | 0 refills | Status: AC | PRN

## 2022-03-26 MED ORDER — HYDROCODONE-ACETAMINOPHEN 5-325 MG PO TABS
1.0000 | ORAL_TABLET | Freq: Once | ORAL | Status: AC
  Administered 2022-03-26: 1 via ORAL
  Filled 2022-03-26: qty 1

## 2022-03-26 NOTE — Discharge Instructions (Addendum)
Keep your splint clean and dry at all times.  Wear your sling for comfort.  You may take the pain medication prescribed if needed, however use caution as this medication can make you drowsy.  It can also cause constipation so I do suggest taking a stool softener such as Colace while on this medication.

## 2022-03-26 NOTE — ED Triage Notes (Signed)
Pt presents to ED following fall today, fell on right arm and hand. Pt states she was doing yard work and went to step on limb and fell. Pt denies LOC or hitting head.

## 2022-03-26 NOTE — ED Provider Notes (Cosign Needed Addendum)
[facility] EMERGENCY DEPARTMENT Provider Note   CSN: [id number] Arrival date & time: 03/26/22  1245     History  Chief Complaint  Patient presents with   Lytle Michaels    Phyllis Santos is a 81 y.o. female with a history of hypertension and hypercholesterolemia, surgical history significant for right carpal tunnel release many years ago presenting for evaluation of a [place] like injury to her right wrist, stating she was working in her yard when she accidentally fell when stepping onto a tree limb and landed on her outstretched right hand.  She had immediate pain at her wrist which is worsened with movement, radiates into the upper forearm with movement but denies elbow shoulder, no head or neck pain or injury.  She had no LOC, denies weakness or numbness in the right upper extremity.  Denies any other injuries.  She has had no treatment prior to arrival but was placed in a sling upon arrival here.  The history is provided by the patient.       Home Medications Prior to Admission medications   Medication Sig Start Date End Date Taking? Authorizing Provider  HYDROcodone-acetaminophen (NORCO/VICODIN) 5-325 MG tablet Take 0.5-1 tablets by mouth every 4 (four) hours as needed. 03/26/22  Yes Keiosha Cancro, Almyra Free, PA-C  acetaminophen (TYLENOL) 500 MG tablet Take 500 mg by mouth every 6 (six) hours as needed for moderate pain.    [provider]  aspirin EC 81 MG tablet Take 81 mg by mouth daily.    [provider]  atorvastatin (LIPITOR) 10 MG tablet Take 10 mg by mouth daily.    [provider]  Calcium Carbonate-Vitamin D 600-400 MG-UNIT tablet Take 1 tablet by mouth daily.    [provider]  Cholecalciferol (VITAMIN D) 125 MCG (5000 UT) CAPS Take 5,000 Units by mouth daily.    [provider]  Homeopathic Products [facility] ALLERGY EYE RELIEF OP) Place 1 drop into both eyes daily as needed (watery eyes).    [provider]  hydrochlorothiazide  (HYDRODIURIL) 25 MG tablet Take 25 mg by mouth daily.    [provider]  losartan (COZAAR) 100 MG tablet Take 100 mg by mouth daily.    [provider]  polyethylene glycol-electrolytes (NULYTELY) 420 g solution As directed 10/04/21   Rourk, Cristopher Estimable, MD  polyethylene glycol-electrolytes (NULYTELY) 420 g solution As directed 10/04/21   Rourk, Cristopher Estimable, MD  psyllium (METAMUCIL) 58.6 % powder Take 1 packet by mouth daily. 5 ml    [provider]  sennosides-docusate sodium (SENOKOT-S) 8.6-50 MG tablet Take 1 tablet by mouth at bedtime.    [provider]  vitamin C (ASCORBIC ACID) 500 MG tablet Take 500 mg by mouth daily.    [provider]      Allergies    Penicillins, Codeine, and Levaquin [levofloxacin in d5w]    Review of Systems   Review of Systems  Constitutional:  Negative for fever.  Musculoskeletal:  Positive for arthralgias and joint swelling. Negative for myalgias.  Neurological:  Negative for weakness and numbness.  All other systems reviewed and are negative.   Physical Exam Updated Vital Signs BP 98/78 (BP Location: Left Arm)   Pulse 69   Temp 98.9 F (37.2 C) (Oral)   Resp 18   Ht '5\' 2"'$  (1.575 m)   Wt 90.7 kg   SpO2 96%   BMI 36.58 kg/m  Physical Exam Constitutional:      Appearance: She is well-developed.  HENT:     Head: Atraumatic.  Cardiovascular:     Comments: Pulses equal bilaterally Musculoskeletal:        General: Swelling and tenderness present.     Right wrist: Swelling and bony tenderness present. No deformity. Decreased range of motion. Normal pulse.     Cervical back: Normal range of motion.     Comments: Mild edema dorsal right wrist.    Skin:    General: Skin is warm and dry.     Findings: No bruising or erythema.  Neurological:     Mental Status: She is alert.     Sensory: Sensation is intact. No sensory deficit.     Motor: No weakness.     Deep Tendon Reflexes: Reflexes normal.     Comments:  Distal sensation intact with less than 2 sec cap refill.       ED Results / Procedures / Treatments   Labs (all labs ordered are listed, but only abnormal results are displayed) Labs Reviewed - No data to display  EKG None  Radiology DG Wrist Complete Right  Result Date: 03/26/2022 CLINICAL DATA:  Fall today onto right arm and hand. EXAM: RIGHT WRIST - COMPLETE 3+ VIEW COMPARISON:  None available FINDINGS: There is a comminuted fracture of the distal radial metaphysis extending through the distal articular surface with mild impaction. There is up to 2 mm cortical step-off of the distal fracture component dorsally. No significant transverse displacement of the fracture components. Moderate thumb carpometacarpal and mild triscaphe joint space narrowing and peripheral osteophytosis. 2 mm ulnar positive variance. IMPRESSION: Comminuted, mildly impacted intra-articular acute fracture of the distal radial metaphysis. Electronically Signed   By: Yvonne Kendall M.D.   On: 03/26/2022 13:24    Procedures Procedures   SPLINT APPLICATION Date/Time: 6:23 PM Authorized by: Evalee Jefferson Consent: Verbal consent obtained. Risks and benefits: risks, benefits and alternatives were discussed Consent given by: patient Splint applied by: RN Location details: right forearm Splint type: sugar tong Supplies used: splinting fiberglass, webril, ace wraps. Post-procedure: The splinted body part was neurovascularly unchanged following the procedure. Patient tolerance: Patient tolerated the procedure well with no immediate complications.   Medications Ordered in ED Medications  HYDROcodone-acetaminophen (NORCO/VICODIN) 5-325 MG per tablet 1 tablet (has no administration in time range)    ED Course/ Medical Decision Making/ A&P Clinical Course as of 03/26/22 1456  Mon Mar 26, 2022  1452 GLF [place]. NVI. SG Tong and OP hand follow up. [CC]    Clinical Course User Index [CC] Tretha Sciara, MD                            Medical Decision Making Patient with a [place] fall prior to arrival with right wrist pain, no other complaint of pain or injury.  She is neurovascularly intact.  Imaging revealing an intra-articular comminuted distal radial fracture.  Patient was placed in a sugar-tong, sling provided.  Also discussed ice, elevation, pain management.  Ortho follow-up given.  Amount and/or Complexity of Data Reviewed Radiology: ordered and independent interpretation performed.    Details: Reviewed and agree with interpretation.  Risk Prescription drug management.           Final Clinical Impression(s) / ED Diagnoses Final diagnoses:  Other closed intra-articular fracture of distal end of right radius, initial encounter    Rx / DC Orders ED Discharge Orders          Ordered  HYDROcodone-acetaminophen (NORCO/VICODIN) 5-325 MG tablet  Every 4 hours PRN        03/26/22 1454              Evalee Jefferson, PA-C 03/26/22 1457    Evalee Jefferson, PA-C 03/26/22 1459    Tretha Sciara, MD 03/27/22 [phone]

## 2022-03-29 ENCOUNTER — Ambulatory Visit (INDEPENDENT_AMBULATORY_CARE_PROVIDER_SITE_OTHER): Payer: Medicare Other | Admitting: Orthopedic Surgery

## 2022-03-29 ENCOUNTER — Encounter: Payer: Self-pay | Admitting: Orthopaedic Surgery

## 2022-03-29 ENCOUNTER — Encounter: Payer: Self-pay | Admitting: Orthopedic Surgery

## 2022-03-29 ENCOUNTER — Ambulatory Visit: Payer: Medicare Other | Admitting: Orthopaedic Surgery

## 2022-03-29 VITALS — BP 131/65 | HR 67 | Ht 62.0 in | Wt 202.0 lb

## 2022-03-29 VITALS — Ht 62.0 in | Wt 200.0 lb

## 2022-03-29 DIAGNOSIS — S52501A Unspecified fracture of the lower end of right radius, initial encounter for closed fracture: Secondary | ICD-10-CM

## 2022-03-29 NOTE — Patient Instructions (Signed)
Come back to see Dr.Harrison this afternoon at 2pm to discuss possible surgery.

## 2022-03-29 NOTE — Progress Notes (Signed)
Chief Complaint  Patient presents with   Fracture    Rt wrist    Seen bu Dr Luna Glasgow buthe doesn't do surgery and felt surgery should be considered   I reviewed the images and I think it can be treated with a cast with minimal if any compromise to wrist function   I offered the patient surgical versus nonsurgical intervention she opted for non surgical intervention and therefore we will place her in a cast  The x-ray shows an intra-articular fracture of the distal radius with some joint depression  However the patient 81 years old does not work has no gross deformity of the joint  X-ray in 2 weeks check alignment  Charge office visits only from now on

## 2022-03-29 NOTE — Progress Notes (Signed)
I fell.  She fell while raking leaves Monday, November 13 and hurt her right wrist.  She was seen in the ER.  X-rays showed; IMPRESSION: Comminuted, mildly impacted intra-articular acute fracture of the distal radial metaphysis.  She was placed in splint and sling and told to follow-up here.  She has no other injury.  I have reviewed the ER notes and X-rays.  I have independently reviewed and interpreted x-rays of this patient done at another site by another physician or qualified health professional.  She has a fracture that most likely needs surgery.  I will have Dr. Aline Brochure see her at [facility] today and he can evaluate.  Patient is agreeable to this.  NV intact. She is in a sugar tong splint.  Encounter Diagnosis  Name Primary?   Closed traumatic displaced fracture of distal end of right radius, initial encounter Yes   To see Dr. Aline Brochure later today.  Call if any problem.  Precautions discussed.  Electronically Signed Sanjuana Kava, MD 11/16/20239:02 AM

## 2022-03-30 ENCOUNTER — Telehealth: Payer: Self-pay

## 2022-03-30 ENCOUNTER — Ambulatory Visit (HOSPITAL_COMMUNITY): Payer: Medicare Other

## 2022-03-30 NOTE — Telephone Encounter (Signed)
     Patient  visit on 11/13  at [place] you been able to follow up with your primary care physician? Yes   The patient was or was not able to obtain any needed medicine or equipment. Yes   Are there diet recommendations that you are having difficulty following? Na   Patient expresses understanding of discharge instructions and education provided has no other needs at this time.  Yes     [place], [facility], Care Management  [phone] [address], [place],  [address] Phone: [phone] Email: [email or web address]

## 2022-04-03 DIAGNOSIS — S6291XD Unspecified fracture of right wrist and hand, subsequent encounter for fracture with routine healing: Secondary | ICD-10-CM | POA: Diagnosis not present

## 2022-04-03 DIAGNOSIS — M8580 Other specified disorders of bone density and structure, unspecified site: Secondary | ICD-10-CM | POA: Diagnosis not present

## 2022-04-12 ENCOUNTER — Ambulatory Visit: Payer: Medicare Other | Admitting: Orthopedic Surgery

## 2022-04-12 ENCOUNTER — Ambulatory Visit (INDEPENDENT_AMBULATORY_CARE_PROVIDER_SITE_OTHER): Payer: Medicare Other

## 2022-04-12 DIAGNOSIS — S52501D Unspecified fracture of the lower end of right radius, subsequent encounter for closed fracture with routine healing: Secondary | ICD-10-CM

## 2022-04-12 DIAGNOSIS — S52509D Unspecified fracture of the lower end of unspecified radius, subsequent encounter for closed fracture with routine healing: Secondary | ICD-10-CM | POA: Insufficient documentation

## 2022-04-12 NOTE — Progress Notes (Signed)
Follow-up fracture distal radius  Cast after treatment  Chief Complaint  Patient presents with   Wrist Injury    Right 03/26/22     81 year old female fracture of the right distal radius.  Option was chosen for cast immobilization   17 days after injury.  Casting.  Cast intact neurovascular exam fingers color capillary refill all intact  Imaging shows no change in position of the fracture see x-ray report  Continue cast immobilization for 4 weeks then x-rays out of plaster  New cast due to loosening

## 2022-04-30 DIAGNOSIS — H35373 Puckering of macula, bilateral: Secondary | ICD-10-CM | POA: Diagnosis not present

## 2022-05-10 ENCOUNTER — Encounter: Payer: Self-pay | Admitting: Orthopedic Surgery

## 2022-05-10 ENCOUNTER — Ambulatory Visit (INDEPENDENT_AMBULATORY_CARE_PROVIDER_SITE_OTHER): Payer: Medicare Other

## 2022-05-10 ENCOUNTER — Ambulatory Visit (INDEPENDENT_AMBULATORY_CARE_PROVIDER_SITE_OTHER): Payer: Medicare Other | Admitting: Orthopedic Surgery

## 2022-05-10 DIAGNOSIS — S52501D Unspecified fracture of the lower end of right radius, subsequent encounter for closed fracture with routine healing: Secondary | ICD-10-CM

## 2022-05-10 NOTE — Progress Notes (Signed)
Chief Complaint  Patient presents with   Wrist Injury    Right 03/26/22   45 days 6-week status post right distal radius fracture.  Patient opted for nonsurgical treatment and given the options of surgery versus not surgery   On the last visit she was x-rayed with the cast and scheduled for x-ray out of plaster today   X-ray shows dorsal tilt is a split of the distal radial fragment as well.  There is some shortening of the fracture  She has some pain with wrist flexion she has good flexion extension movement of her fingers  She will start active range of motion exercises twice a day out of the removable splint  X-ray 4 weeks

## 2022-05-16 DIAGNOSIS — N6002 Solitary cyst of left breast: Secondary | ICD-10-CM | POA: Diagnosis not present

## 2022-05-16 DIAGNOSIS — J029 Acute pharyngitis, unspecified: Secondary | ICD-10-CM | POA: Diagnosis not present

## 2022-05-16 DIAGNOSIS — Z87891 Personal history of nicotine dependence: Secondary | ICD-10-CM | POA: Diagnosis not present

## 2022-05-29 ENCOUNTER — Ambulatory Visit: Payer: Medicare Other | Admitting: General Surgery

## 2022-05-30 ENCOUNTER — Ambulatory Visit: Payer: Medicare Other | Admitting: General Surgery

## 2022-05-30 ENCOUNTER — Encounter: Payer: Self-pay | Admitting: General Surgery

## 2022-05-30 VITALS — BP 139/74 | HR 71 | Temp 97.6°F | Resp 16 | Ht 62.0 in | Wt 200.0 lb

## 2022-05-30 DIAGNOSIS — L02213 Cutaneous abscess of chest wall: Secondary | ICD-10-CM | POA: Diagnosis not present

## 2022-05-30 MED ORDER — DOXYCYCLINE HYCLATE 50 MG PO CAPS: 50.0000 mg | ORAL_CAPSULE | Freq: Two times a day (BID) | ORAL | 0 refills | Status: AC

## 2022-05-30 NOTE — Progress Notes (Signed)
[facility] History and Physical  Reason for Referral: Abscess under left breast  Referring Physician: Self  Chief Complaint   Other     Phyllis Santos is a 82 y.o. female.  HPI: Ms. Phyllis Santos is known to me after I removed a cyst from her neck. She starting having swelling and pain after Christmas under her left breast and this worsened and ruptured on Sunday. The area is tender and is draining purulence. She says she had a cyst /abscess I&D in the sternal area by Dr. Romona Curls a long time ago. She has never noticed a cyst in this area before.  Past Medical History:  Diagnosis Date   Hypercholesterolemia    Hypertension     Past Surgical History:  Procedure Laterality Date   APPENDECTOMY     Dr Wonda Olds   CARPAL TUNNEL RELEASE     right-Dr Luna Glasgow   CATARACT EXTRACTION [facility]  08/16/2011   Procedure: CATARACT EXTRACTION PHACO AND INTRAOCULAR LENS PLACEMENT ([place]);  Surgeon: Tonny Branch, MD;  Location: [facility] ORS;  Service: Ophthalmology;  Laterality: Left;  CDE: 12.45   CATARACT EXTRACTION W/PHACO  09/03/2011   Procedure: CATARACT EXTRACTION PHACO AND INTRAOCULAR LENS PLACEMENT (IOC);  Surgeon: Tonny Branch, MD;  Location: [facility] ORS;  Service: Ophthalmology;  Laterality: Right;  CDE:13.47   CHOLECYSTECTOMY     [facility]   COLONOSCOPY N/A 05/18/2016   one 7 mm polyp s/p removal, ond 5 mm polyp from rectum, one polyp from transverse colon. Diverticulosis in sigmoid, descending, and transverse colon. Tubular adenomas and hyperplastic.   COLONOSCOPY WITH PROPOFOL N/A 11/01/2021   Procedure: COLONOSCOPY WITH PROPOFOL;  Surgeon: Daneil Dolin, MD;  Location: [facility] ENDO SUITE;  Service: Endoscopy;  Laterality: N/A;  1:45pm   CYST EXCISION Right 03/01/2021   Procedure: EXCISION CYST, CHIN;  Surgeon: Virl Cagey, MD;  Location: [facility] ORS;  Service: General;  Laterality: Right;   DILATION AND CURETTAGE OF UTERUS     Dr Elonda Husky 3-4 yrs ago   EXCISION OF BACK LESION Right  03/01/2021   Procedure: EXCISION OF BACK CYST;  Surgeon: Virl Cagey, MD;  Location: [facility] ORS;  Service: General;  Laterality: Right;   TUBAL LIGATION     Dr Wonda Olds    Family History  Problem Relation Age of Onset   Colon cancer Brother 46       Still alive, in his eighties   Anesthesia problems Neg Hx    Hypotension Neg Hx    Malignant hyperthermia Neg Hx    Pseudochol deficiency Neg Hx     Social History   Tobacco Use   Smoking status: Former    Packs/day: 0.50    Years: 10.00    Total pack years: 5.00    Types: Cigarettes    Quit date: 08/07/1994    Years since quitting: 27.8   Smokeless tobacco: Never  Substance Use Topics   Alcohol use: No   Drug use: No    Medications: I have reviewed the patient's current medications. Allergies as of 05/30/2022       Reactions   Penicillins Anaphylaxis, Rash   Rash around neck Has patient had a PCN reaction causing immediate rash, facial/tongue/throat swelling, SOB or lightheadedness with hypotension: {yes Has patient had a PCN reaction causing severe rash involving mucus membranes or skin necrosis: {yes Has patient had a PCN reaction that required hospitalization {no Has patient had a PCN reaction occurring within the last 10 years: {no If all of the  above answers are "NO", then may proceed with Cephalosporin use.   Codeine Other (See Comments)   Spasms in stomach   Levaquin [levofloxacin In D5w] Other (See Comments)   Numbness in arms        Medication List        Accurate as of May 30, 2022 10:43 AM. If you have any questions, ask your nurse or doctor.          acetaminophen 500 MG tablet Commonly known as: TYLENOL Take 500 mg by mouth every 6 (six) hours as needed for moderate pain.   ascorbic acid 500 MG tablet Commonly known as: VITAMIN C Take 500 mg by mouth daily.   aspirin EC 81 MG tablet Take 81 mg by mouth daily.   atorvastatin 10 MG tablet Commonly known as: LIPITOR Take 10 mg by  mouth daily.   Calcium Carbonate-Vitamin D 600-400 MG-UNIT tablet Take 1 tablet by mouth daily.   doxycycline 50 MG capsule Commonly known as: VIBRAMYCIN Take 1 capsule (50 mg total) by mouth 2 (two) times daily for 7 days. Started by: Virl Cagey, MD   hydrochlorothiazide 25 MG tablet Commonly known as: HYDRODIURIL Take 25 mg by mouth daily.   HYDROcodone-acetaminophen 5-325 MG tablet Commonly known as: NORCO/VICODIN Take 0.5-1 tablets by mouth every 4 (four) hours as needed.   losartan 100 MG tablet Commonly known as: COZAAR Take 100 mg by mouth daily.   polyethylene glycol-electrolytes 420 g solution Commonly known as: NuLYTELY As directed   psyllium 58.6 % powder Commonly known as: METAMUCIL Take 1 packet by mouth daily. 5 ml   sennosides-docusate sodium 8.6-50 MG tablet Commonly known as: SENOKOT-S Take 1 tablet by mouth at bedtime.   SIMILASAN ALLERGY EYE RELIEF OP Place 1 drop into both eyes daily as needed (watery eyes).   Vitamin D 125 MCG (5000 UT) Caps Take 5,000 Units by mouth daily.         ROS:  A comprehensive review of systems was negative except for: Integument/breast: positive for redness and swelling under the left breast with drainage  Blood pressure 139/74, pulse 71, temperature 97.6 F (36.4 C), temperature source Oral, resp. rate 16, height '5\' 2"'$  (1.575 m), weight 200 lb (90.7 kg), SpO2 96 %. Physical Exam Vitals reviewed.  HENT:     Head: Normocephalic.     Nose: Nose normal.  Eyes:     Extraocular Movements: Extraocular movements intact.  Cardiovascular:     Rate and Rhythm: Normal rate and regular rhythm.  Pulmonary:     Effort: Pulmonary effort is normal.  Abdominal:     General: There is no distension.     Palpations: Abdomen is soft.     Tenderness: There is no abdominal tenderness.  Musculoskeletal:     Cervical back: Normal range of motion.     Comments: Left chest under breast; 3cm area of redness and induration  with fluctuance, draining purulence   Skin:    General: Skin is warm.  Neurological:     General: No focal deficit present.     Mental Status: She is alert.  Psychiatric:        Mood and Affect: Mood normal.        Behavior: Behavior normal.     Results: None   Procedure:  Incision and drainage  Diagnosis: Abscess left chest wall  Description: The area under the left breast/ mammary fold was prepped with betadine and lidocaine was injected. An incision made and  bloody purulence was evacuated. Saline was used to irrigate. Packing was placed and gauze and paper tape was placed.   Assessment & Plan:  Phyllis Santos is a 82 y.o. female with an left breast/ mammary fold abscess.   We discussed incision and drainage and risk of bleeding, infection.   All questions were answered to the satisfaction of the patient.  Pack your wound daily to twice daily starting tomorrow.   If it is bleeding hold pressure. When you remove the packing you can wet it with saline to help remove the packing. Take the antibiotic.  Call with any questions or concerns.   Take tylenol or ibuprofen for pain.   Future Appointments  Date Time Provider [place]  06/06/2022  9:45 AM Virl Cagey, MD RS-RS None  06/07/2022  4:00 PM Carole Civil, MD OCR-OCR None     Virl Cagey 05/30/2022, 10:43 AM

## 2022-05-30 NOTE — Patient Instructions (Addendum)
Pack your wound daily to twice daily starting tomorrow.   If it is bleeding hold pressure. When you remove the packing you can wet it with saline to help remove the packing. Take the antibiotic.  Call with any questions or concerns.   Take tylenol or ibuprofen for pain.

## 2022-06-06 ENCOUNTER — Other Ambulatory Visit: Payer: Self-pay

## 2022-06-06 ENCOUNTER — Encounter: Payer: Self-pay | Admitting: General Surgery

## 2022-06-06 ENCOUNTER — Ambulatory Visit (INDEPENDENT_AMBULATORY_CARE_PROVIDER_SITE_OTHER): Payer: Medicare Other | Admitting: General Surgery

## 2022-06-06 VITALS — BP 124/75 | HR 68 | Temp 98.6°F | Resp 16 | Ht 62.0 in | Wt 202.0 lb

## 2022-06-06 DIAGNOSIS — E782 Mixed hyperlipidemia: Secondary | ICD-10-CM | POA: Diagnosis not present

## 2022-06-06 DIAGNOSIS — R7301 Impaired fasting glucose: Secondary | ICD-10-CM | POA: Diagnosis not present

## 2022-06-06 DIAGNOSIS — L02213 Cutaneous abscess of chest wall: Secondary | ICD-10-CM

## 2022-06-06 NOTE — Progress Notes (Signed)
[facility]  Wound being packed. Some drainage.  BP 124/75   Pulse 68   Temp 98.6 F (37 C) (Oral)   Resp 16   Ht '5\' 2"'$  (1.575 m)   Wt 202 lb (91.6 kg)   SpO2 95%   BMI 36.95 kg/m  Incision packed, some blood drainage Induration improving  Patient s/p I&D of infected cyst/ abscess. Doing well. Continue packing. Call with issues.   Future Appointments  Date Time Provider [place]  06/07/2022  4:00 PM Carole Civil, MD OCR-OCR None  06/21/2022  1:15 PM Virl Cagey, MD RS-RS None   Curlene Labrum, MD [facility] [address] [place], [place] [address] [phone] (office)

## 2022-06-06 NOTE — Patient Instructions (Signed)
Continue packing. Call with issues.

## 2022-06-07 ENCOUNTER — Ambulatory Visit: Payer: Medicare Other | Admitting: Orthopedic Surgery

## 2022-06-07 ENCOUNTER — Ambulatory Visit (INDEPENDENT_AMBULATORY_CARE_PROVIDER_SITE_OTHER): Payer: Medicare Other

## 2022-06-07 ENCOUNTER — Encounter: Payer: Self-pay | Admitting: Orthopedic Surgery

## 2022-06-07 DIAGNOSIS — S52501D Unspecified fracture of the lower end of right radius, subsequent encounter for closed fracture with routine healing: Secondary | ICD-10-CM

## 2022-06-07 NOTE — Progress Notes (Signed)
Chief Complaint  Patient presents with   Wrist Injury    Right 03/26/22    Encounter Diagnosis  Name Primary?   Closed traumatic displaced fracture of distal end of right radius with routine healing, subsequent encounter 03/26/22 Yes   Phyllis Santos is doing well 10 weeks post cast and brace tx for DRF  No complaints   Xrays frx healed with minimal shortening and minimal angulation  She has good function of the right hand  I released her today.

## 2022-06-13 DIAGNOSIS — I700 Atherosclerosis of aorta: Secondary | ICD-10-CM | POA: Diagnosis not present

## 2022-06-13 DIAGNOSIS — R7301 Impaired fasting glucose: Secondary | ICD-10-CM | POA: Diagnosis not present

## 2022-06-13 DIAGNOSIS — M8580 Other specified disorders of bone density and structure, unspecified site: Secondary | ICD-10-CM | POA: Diagnosis not present

## 2022-06-13 DIAGNOSIS — R600 Localized edema: Secondary | ICD-10-CM | POA: Diagnosis not present

## 2022-06-13 DIAGNOSIS — M5442 Lumbago with sciatica, left side: Secondary | ICD-10-CM | POA: Diagnosis not present

## 2022-06-13 DIAGNOSIS — E782 Mixed hyperlipidemia: Secondary | ICD-10-CM | POA: Diagnosis not present

## 2022-06-13 DIAGNOSIS — D649 Anemia, unspecified: Secondary | ICD-10-CM | POA: Diagnosis not present

## 2022-06-13 DIAGNOSIS — M5441 Lumbago with sciatica, right side: Secondary | ICD-10-CM | POA: Diagnosis not present

## 2022-06-13 DIAGNOSIS — I10 Essential (primary) hypertension: Secondary | ICD-10-CM | POA: Diagnosis not present

## 2022-06-13 DIAGNOSIS — Z0001 Encounter for general adult medical examination with abnormal findings: Secondary | ICD-10-CM | POA: Diagnosis not present

## 2022-06-21 ENCOUNTER — Ambulatory Visit (INDEPENDENT_AMBULATORY_CARE_PROVIDER_SITE_OTHER): Payer: Medicare Other | Admitting: General Surgery

## 2022-06-21 ENCOUNTER — Encounter: Payer: Self-pay | Admitting: General Surgery

## 2022-06-21 VITALS — BP 127/59 | HR 74 | Temp 98.1°F | Resp 16 | Ht 62.0 in | Wt 205.0 lb

## 2022-06-21 DIAGNOSIS — L02213 Cutaneous abscess of chest wall: Secondary | ICD-10-CM | POA: Diagnosis not present

## 2022-06-21 NOTE — Progress Notes (Signed)
[facility]  Left chest wall abscess healing. Not packing any more.   BP (!) 127/59   Pulse 74   Temp 98.1 F (36.7 C) (Oral)   Resp 16   Ht '5\' 2"'$  (1.575 m)   Wt 205 lb (93 kg)   SpO2 96%   BMI 37.49 kg/m  Site with minimal drainage, no erythema  Patient s/p left chest wall abscess/ infected cyst.  Bandage as needed.  Curlene Labrum, MD [facility] [address] [place], [place] [address] [phone] (office)

## 2022-06-21 NOTE — Patient Instructions (Signed)
Bandage as needed.

## 2022-07-13 ENCOUNTER — Other Ambulatory Visit (HOSPITAL_COMMUNITY): Payer: Self-pay | Admitting: Internal Medicine

## 2022-07-13 DIAGNOSIS — M8580 Other specified disorders of bone density and structure, unspecified site: Secondary | ICD-10-CM

## 2022-07-20 ENCOUNTER — Ambulatory Visit (HOSPITAL_COMMUNITY)
Admission: RE | Admit: 2022-07-20 | Discharge: 2022-07-20 | Disposition: A | Discharge status: Home / Self Care | Payer: Medicare Other | Source: Ambulatory Visit | Attending: Internal Medicine | Admitting: Internal Medicine

## 2022-07-20 ENCOUNTER — Encounter (HOSPITAL_COMMUNITY): Payer: Self-pay

## 2022-07-20 DIAGNOSIS — Z1231 Encounter for screening mammogram for malignant neoplasm of breast: Secondary | ICD-10-CM | POA: Insufficient documentation

## 2022-07-23 ENCOUNTER — Ambulatory Visit (HOSPITAL_COMMUNITY)
Admission: RE | Admit: 2022-07-23 | Discharge: 2022-07-23 | Disposition: A | Discharge status: Home / Self Care | Payer: Medicare Other | Source: Ambulatory Visit | Attending: Internal Medicine | Admitting: Internal Medicine

## 2022-07-23 DIAGNOSIS — M8589 Other specified disorders of bone density and structure, multiple sites: Secondary | ICD-10-CM | POA: Diagnosis not present

## 2022-07-23 DIAGNOSIS — M8580 Other specified disorders of bone density and structure, unspecified site: Secondary | ICD-10-CM | POA: Diagnosis not present

## 2022-07-23 DIAGNOSIS — Z780 Asymptomatic menopausal state: Secondary | ICD-10-CM | POA: Diagnosis not present

## 2022-07-23 DIAGNOSIS — M85852 Other specified disorders of bone density and structure, left thigh: Secondary | ICD-10-CM | POA: Diagnosis not present

## 2022-12-24 DIAGNOSIS — E782 Mixed hyperlipidemia: Secondary | ICD-10-CM | POA: Diagnosis not present

## 2022-12-24 DIAGNOSIS — R7301 Impaired fasting glucose: Secondary | ICD-10-CM | POA: Diagnosis not present

## 2022-12-27 IMAGING — DX DG KNEE 1-2V*L*
2 series · 2 of 2 positions shown · non-contrast
Comparison: None.

CLINICAL DATA: Left knee pain

EXAM:
LEFT KNEE - 2 VIEW

[knee ap]
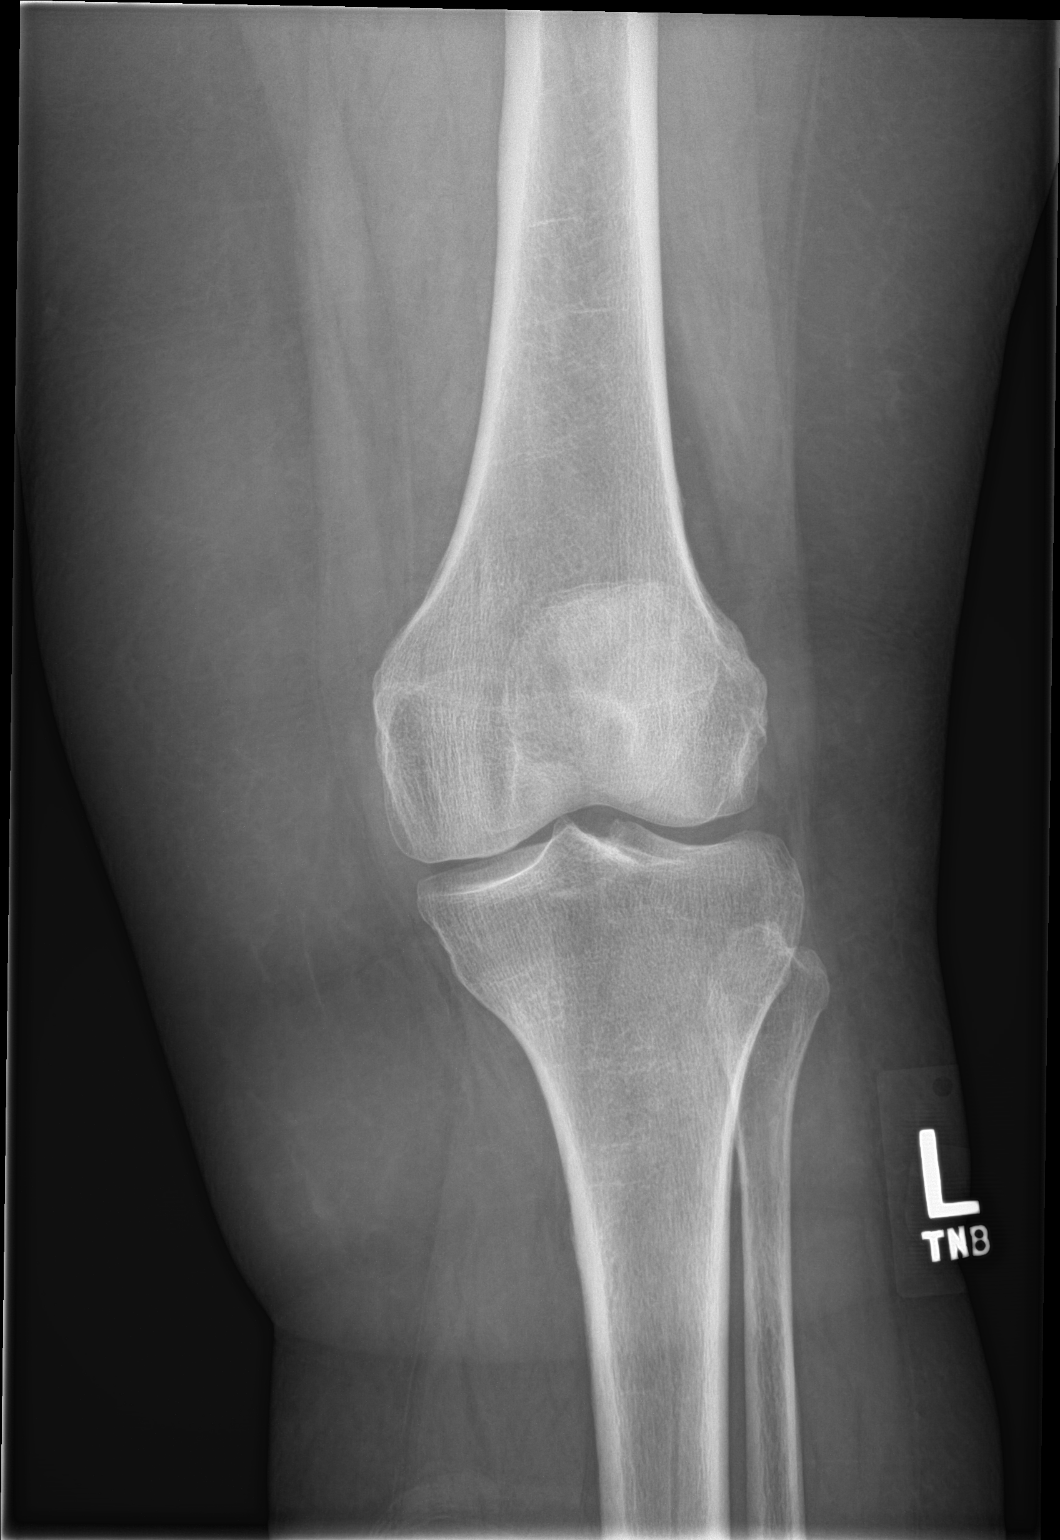

[knee lat]
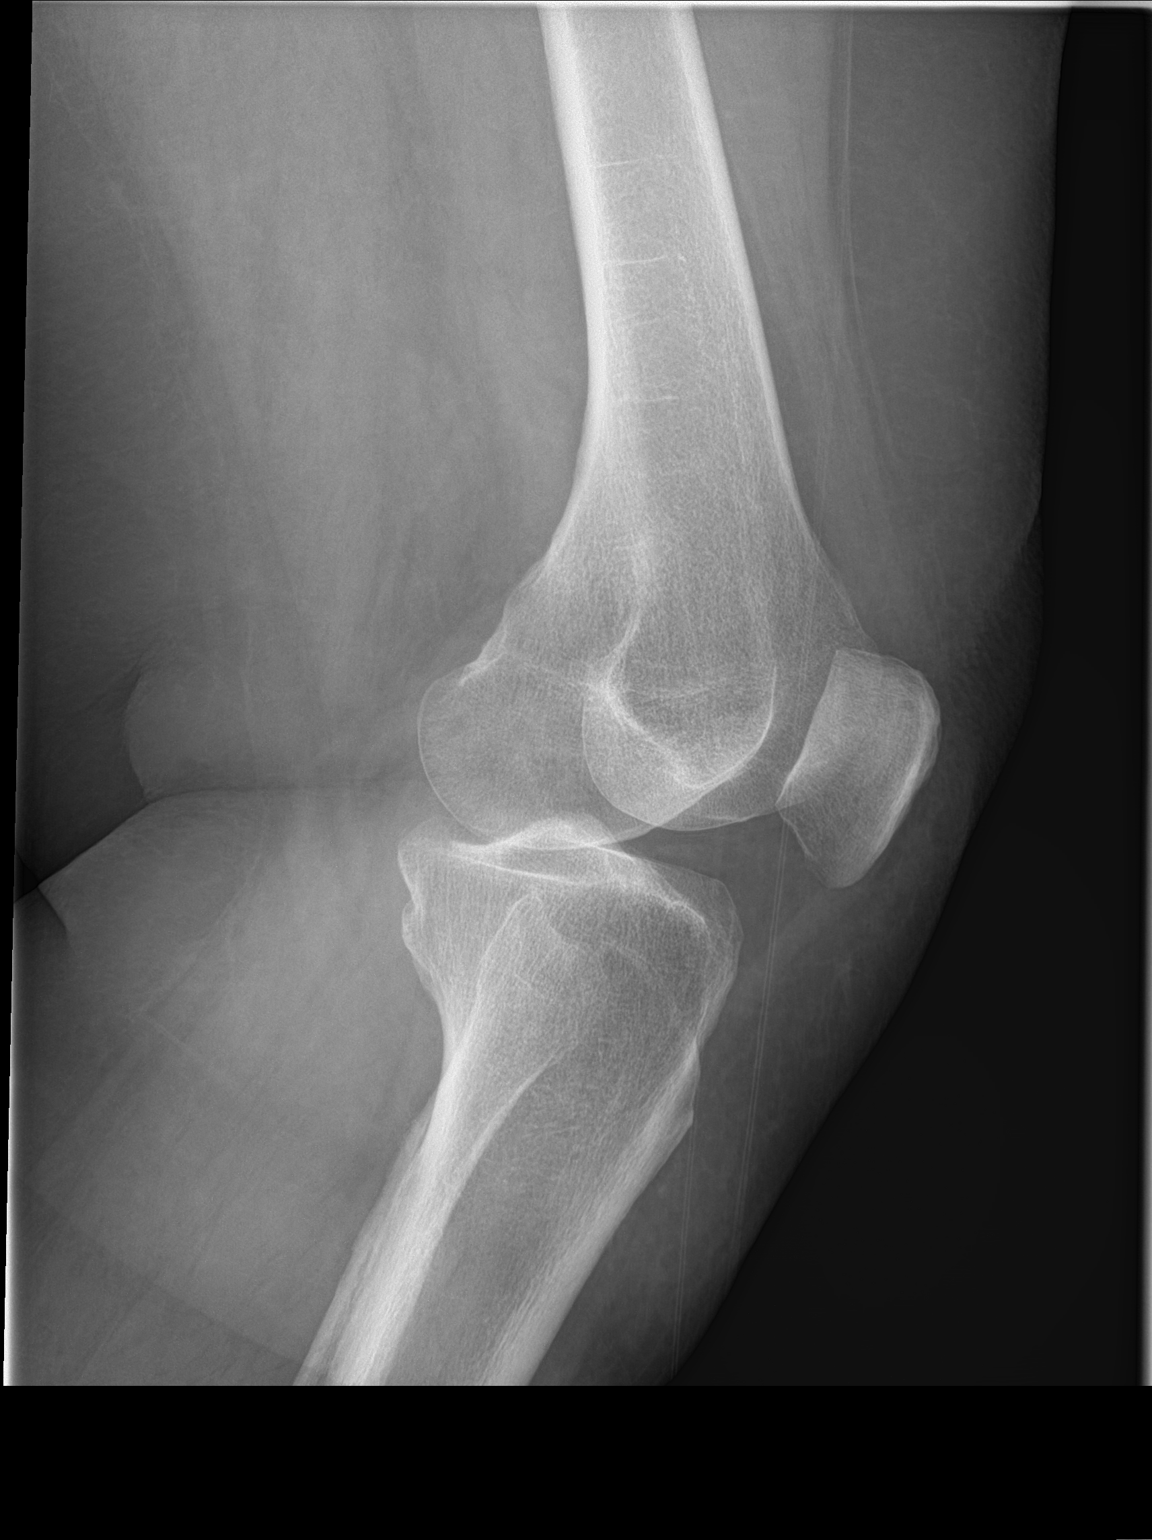

[2 of 2 positions shown; findings below may reference images not displayed]

FINDINGS: No evidence of fracture, dislocation, or joint effusion. No evidence
of arthropathy or other focal bone abnormality. Soft tissues are
unremarkable.
IMPRESSION: No acute abnormality noted.

## 2022-12-31 DIAGNOSIS — M8580 Other specified disorders of bone density and structure, unspecified site: Secondary | ICD-10-CM | POA: Diagnosis not present

## 2022-12-31 DIAGNOSIS — R600 Localized edema: Secondary | ICD-10-CM | POA: Diagnosis not present

## 2022-12-31 DIAGNOSIS — I700 Atherosclerosis of aorta: Secondary | ICD-10-CM | POA: Diagnosis not present

## 2022-12-31 DIAGNOSIS — M5441 Lumbago with sciatica, right side: Secondary | ICD-10-CM | POA: Diagnosis not present

## 2022-12-31 DIAGNOSIS — M5442 Lumbago with sciatica, left side: Secondary | ICD-10-CM | POA: Diagnosis not present

## 2022-12-31 DIAGNOSIS — K5909 Other constipation: Secondary | ICD-10-CM | POA: Diagnosis not present

## 2022-12-31 DIAGNOSIS — R7301 Impaired fasting glucose: Secondary | ICD-10-CM | POA: Diagnosis not present

## 2022-12-31 DIAGNOSIS — I10 Essential (primary) hypertension: Secondary | ICD-10-CM | POA: Diagnosis not present

## 2022-12-31 DIAGNOSIS — E782 Mixed hyperlipidemia: Secondary | ICD-10-CM | POA: Diagnosis not present

## 2022-12-31 DIAGNOSIS — D649 Anemia, unspecified: Secondary | ICD-10-CM | POA: Diagnosis not present

## 2023-01-02 ENCOUNTER — Emergency Department (HOSPITAL_COMMUNITY)
Admission: EM | Admit: 2023-01-02 | Discharge: 2023-01-02 | Disposition: A | Discharge status: Home / Self Care | Payer: Medicare Other | Attending: Emergency Medicine | Admitting: Emergency Medicine

## 2023-01-02 ENCOUNTER — Encounter (HOSPITAL_COMMUNITY): Payer: Self-pay | Admitting: Pharmacy Technician

## 2023-01-02 ENCOUNTER — Other Ambulatory Visit: Payer: Self-pay

## 2023-01-02 ENCOUNTER — Emergency Department (HOSPITAL_COMMUNITY): Payer: Medicare Other

## 2023-01-02 DIAGNOSIS — R079 Chest pain, unspecified: Secondary | ICD-10-CM | POA: Diagnosis not present

## 2023-01-02 DIAGNOSIS — K5901 Slow transit constipation: Secondary | ICD-10-CM | POA: Insufficient documentation

## 2023-01-02 DIAGNOSIS — N281 Cyst of kidney, acquired: Secondary | ICD-10-CM | POA: Diagnosis not present

## 2023-01-02 DIAGNOSIS — D1803 Hemangioma of intra-abdominal structures: Secondary | ICD-10-CM | POA: Diagnosis not present

## 2023-01-02 DIAGNOSIS — K449 Diaphragmatic hernia without obstruction or gangrene: Secondary | ICD-10-CM | POA: Diagnosis not present

## 2023-01-02 DIAGNOSIS — Z7982 Long term (current) use of aspirin: Secondary | ICD-10-CM | POA: Insufficient documentation

## 2023-01-02 DIAGNOSIS — N289 Disorder of kidney and ureter, unspecified: Secondary | ICD-10-CM | POA: Diagnosis not present

## 2023-01-02 DIAGNOSIS — I10 Essential (primary) hypertension: Secondary | ICD-10-CM | POA: Diagnosis not present

## 2023-01-02 DIAGNOSIS — K5730 Diverticulosis of large intestine without perforation or abscess without bleeding: Secondary | ICD-10-CM | POA: Diagnosis not present

## 2023-01-02 DIAGNOSIS — R1032 Left lower quadrant pain: Secondary | ICD-10-CM | POA: Diagnosis not present

## 2023-01-02 DIAGNOSIS — R0789 Other chest pain: Secondary | ICD-10-CM | POA: Diagnosis not present

## 2023-01-02 DIAGNOSIS — K5900 Constipation, unspecified: Secondary | ICD-10-CM | POA: Diagnosis present

## 2023-01-02 LAB — URINALYSIS, ROUTINE W REFLEX MICROSCOPIC
Bilirubin Urine: NEGATIVE
Glucose, UA: NEGATIVE mg/dL
Hgb urine dipstick: NEGATIVE
Ketones, ur: NEGATIVE mg/dL
Leukocytes,Ua: NEGATIVE
Nitrite: NEGATIVE
Protein, ur: NEGATIVE mg/dL
Specific Gravity, Urine: 1.009 (ref 1.005–1.030)
pH: 8 (ref 5.0–8.0)

## 2023-01-02 LAB — CBC WITH DIFFERENTIAL/PLATELET
Abs Immature Granulocytes: 0.02 10*3/uL (ref 0.00–0.07)
Basophils Absolute: 0 10*3/uL (ref 0.0–0.1)
Basophils Relative: 0 %
Eosinophils Absolute: 0.1 10*3/uL (ref 0.0–0.5)
Eosinophils Relative: 1 %
HCT: 32.6 % — ABNORMAL LOW (ref 36.0–46.0)
Hemoglobin: 10.6 g/dL — ABNORMAL LOW (ref 12.0–15.0)
Immature Granulocytes: 0 %
Lymphocytes Relative: 9 %
Lymphs Abs: 0.8 10*3/uL (ref 0.7–4.0)
MCH: 28.6 pg (ref 26.0–34.0)
MCHC: 32.5 g/dL (ref 30.0–36.0)
MCV: 87.9 fL (ref 80.0–100.0)
Monocytes Absolute: 0.3 10*3/uL (ref 0.1–1.0)
Monocytes Relative: 3 %
Neutro Abs: 7.9 10*3/uL — ABNORMAL HIGH (ref 1.7–7.7)
Neutrophils Relative %: 87 %
Platelets: 276 10*3/uL (ref 150–400)
RBC: 3.71 MIL/uL — ABNORMAL LOW (ref 3.87–5.11)
RDW: 15 % (ref 11.5–15.5)
WBC: 9.1 10*3/uL (ref 4.0–10.5)
nRBC: 0 % (ref 0.0–0.2)

## 2023-01-02 LAB — BASIC METABOLIC PANEL
Anion gap: 8 (ref 5–15)
BUN: 11 mg/dL (ref 8–23)
CO2: 26 mmol/L (ref 22–32)
Calcium: 8.7 mg/dL — ABNORMAL LOW (ref 8.9–10.3)
Chloride: 100 mmol/L (ref 98–111)
Creatinine, Ser: 0.65 mg/dL (ref 0.44–1.00)
GFR, Estimated: >60 mL/min (ref >60)
Glucose, Bld: 102 mg/dL — ABNORMAL HIGH (ref 70–99)
Potassium: 3.5 mmol/L (ref 3.5–5.1)
Sodium: 134 mmol/L — ABNORMAL LOW (ref 135–145)

## 2023-01-02 LAB — TROPONIN I (HIGH SENSITIVITY)
Troponin I (High Sensitivity): 7 ng/L (ref <18)
Troponin I (High Sensitivity): 7 ng/L (ref <18)

## 2023-01-02 MED ORDER — MAGNESIUM CITRATE PO SOLN
1.0000 | Freq: Once | ORAL | Status: AC
  Administered 2023-01-02: 1 via ORAL
  Filled 2023-01-02: qty 296

## 2023-01-02 MED ORDER — FLEET ENEMA RE ENEM: 1.0000 | ENEMA | Freq: Once | RECTAL | Status: DC

## 2023-01-02 NOTE — ED Triage Notes (Signed)
Pt here with reports of constipation for the last 4 days. Reports normally has a BM every other day. Tried a fleet enema yesterday with no relief. Pt also complains of L sided abdominal pain for the last week and L sided chest pain onset this morning when she woke up.

## 2023-01-02 NOTE — ED Notes (Signed)
Call light within reach, informed pt to hit the call button for whenever she needs assistance with unhooking herself to go to the bathroom

## 2023-01-02 NOTE — Discharge Instructions (Signed)
You were seen in the emergency room for constipation.  Please continue to take MiraLAX as prescribed and follow-up with your primary care doctor.  As discussed, you were found to have renal cyst on the right side.  Please discuss this incidental finding with your PCP so that they can monitor it.

## 2023-01-02 NOTE — ED Provider Notes (Signed)
EMERGENCY DEPARTMENT AT [facility] Provider Note   CSN: [id number] Arrival date & time: 01/02/23  1191     History  Chief Complaint  Patient presents with   Constipation   Abdominal Pain    Phyllis Santos is a 82 y.o. female.  HPI    82 y/o patient comes in with cc of constipation and abdominal pain along with intermittent chest pain.  Pt indicates that she hasn't had a BM in 4 days. Pt has used enema without success. Pt has been taking miralax and stool softeners. She has been passing flatus. Pt has no emesis. She also has LLQ abd pain that is new and has appreciated some chest pain when straining.   Home Medications Prior to Admission medications   Medication Sig Start Date End Date Taking? Authorizing Provider  acetaminophen (TYLENOL) 500 MG tablet Take 500 mg by mouth every 6 (six) hours as needed for moderate pain.    [provider]  aspirin EC 81 MG tablet Take 81 mg by mouth daily.    [provider]  atorvastatin (LIPITOR) 10 MG tablet Take 10 mg by mouth daily.    [provider]  Calcium Carbonate-Vitamin D 600-400 MG-UNIT tablet Take 1 tablet by mouth daily.    [provider]  Cholecalciferol (VITAMIN D) 125 MCG (5000 UT) CAPS Take 5,000 Units by mouth daily.    [provider]  Homeopathic Products [facility] ALLERGY EYE RELIEF OP) Place 1 drop into both eyes daily as needed (watery eyes).    [provider]  hydrochlorothiazide (HYDRODIURIL) 25 MG tablet Take 25 mg by mouth daily.    [provider]  HYDROcodone-acetaminophen (NORCO/VICODIN) 5-325 MG tablet Take 0.5-1 tablets by mouth every 4 (four) hours as needed. 03/26/22   Burgess Amor, PA-C  losartan (COZAAR) 100 MG tablet Take 100 mg by mouth daily.    [provider]  polyethylene glycol-electrolytes (NULYTELY) 420 g solution As directed 10/04/21   Rourk, Gerrit Friends, MD  psyllium (METAMUCIL) 58.6 % powder Take 1  packet by mouth daily. 5 ml    [provider]  sennosides-docusate sodium (SENOKOT-S) 8.6-50 MG tablet Take 1 tablet by mouth at bedtime.    [provider]  vitamin C (ASCORBIC ACID) 500 MG tablet Take 500 mg by mouth daily.    [provider]      Allergies    Penicillins, Codeine, and Levaquin [levofloxacin in d5w]    Review of Systems   Review of Systems  All other systems reviewed and are negative.   Physical Exam Updated Vital Signs BP (!) 132/55   Pulse 65   Temp 98.4 F (36.9 C) (Oral)   Resp (!) 23   SpO2 95%  Physical Exam Vitals and nursing note reviewed.  Constitutional:      Appearance: She is well-developed.  HENT:     Head: Atraumatic.  Cardiovascular:     Rate and Rhythm: Normal rate.  Pulmonary:     Effort: Pulmonary effort is normal.  Abdominal:     Tenderness: There is abdominal tenderness in the suprapubic area and left lower quadrant. There is no guarding or rebound.  Musculoskeletal:     Cervical back: Normal range of motion and neck supple.  Skin:    General: Skin is warm and dry.  Neurological:     Mental Status: She is alert and oriented to person, place, and time.     ED Results / Procedures /  Treatments   Labs (all labs ordered are listed, but only abnormal results are displayed) Labs Reviewed  BASIC METABOLIC PANEL - Abnormal; Notable for the following components:      Result Value   Sodium 134 (*)    Glucose, Bld 102 (*)    Calcium 8.7 (*)    All other components within normal limits  CBC WITH DIFFERENTIAL/PLATELET - Abnormal; Notable for the following components:   RBC 3.71 (*)    Hemoglobin 10.6 (*)    HCT 32.6 (*)    Neutro Abs 7.9 (*)    All other components within normal limits  URINALYSIS, ROUTINE W REFLEX MICROSCOPIC - Abnormal; Notable for the following components:   Color, Urine STRAW (*)    All other components within normal limits  TROPONIN I (HIGH SENSITIVITY)    EKG EKG  Interpretation Date/Time:  Wednesday January 02 2023 08:36:16 EDT Ventricular Rate:  75 PR Interval:  191 QRS Duration:  103 QT Interval:  387 QTC Calculation: 433 R Axis:   -66  Text Interpretation: Sinus rhythm Left anterior fascicular block Abnormal R-wave progression, late transition No acute changes No significant change since last tracing Confirmed by Derwood Kaplan [phone]) on 01/02/2023 8:52:31 AM  Radiology CT ABDOMEN PELVIS WO CONTRAST  Result Date: 01/02/2023 CLINICAL DATA:  Left lower quadrant pain EXAM: CT ABDOMEN AND PELVIS WITHOUT CONTRAST TECHNIQUE: Multidetector CT imaging of the abdomen and pelvis was performed following the standard protocol without IV contrast. RADIATION DOSE REDUCTION: This exam was performed according to the departmental dose-optimization program which includes automated exposure control, adjustment of the mA and/or kV according to patient size and/or use of iterative reconstruction technique. COMPARISON:  CT 03/23/2019 FINDINGS: Lower chest: There is some linear opacity lung bases likely scar or atelectasis. No pleural effusion. There is 12 mm ground-glass nodule left lower lobe series 4, image 9. Heart is borderline enlarged. 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 No additional follow-up. Previous cholecystectomy. Pancreas: Moderate fatty atrophy of the pancreas Spleen: Normal in size without focal abnormality. Adrenals/Urinary Tract: The adrenal glands are preserved. No collecting system dilatation of the kidneys. The ureters have normal course and caliber extending down to the bladder. Stable tiny cystic focus along the left kidney posteriorly, again unchanged from 2020 no specific imaging follow-up. Bosniak 2 lesion. Preserved contours of the urinary bladder.  There is a 16 x 16 mm exophytic lesion from the medial posterior aspect of the right mid kidney. This has clearly some mixed densities and is indeterminate. Previously the smooth of measured 15 x 13 mm. Stomach/Bowel: Large bowel is of normal course and caliber. Diffuse colonic stool extensive left-sided colonic diverticulosis the appendix is not clearly seen in the right lower quadrant but there is no pericecal stranding or fluid. Small hiatal hernia. Vascular/Lymphatic: Aortic atherosclerosis. No enlarged abdominal or pelvic lymph nodes. Reproductive: Heterogeneous uterus consistent with multiple fibroids unchanged from previous no separate adnexal mass Other: Once again there is a 2.1 cm subcutaneous soft tissue nodule along the left upper quadrant anteriorly, unchanged from 2020 consistent with a benign lesion. Possibilities include a sebaceous cysts. Musculoskeletal: Curvature and degenerative changes along the spine. IMPRESSION: No bowel obstruction, free air or free fluid. Extensive left-sided colonic diverticulosis. No obstructing renal stones. Complex lesion along the medial aspect  of the right mid kidney, minimally increased from previous but is indeterminate. Would recommend a renal mass protocol contrast exam with either CT or MRI when clinically appropriate. Previous cholecystectomy.  Small hiatal hernia Electronically Signed   By: Karen Kays M.D.   On: 01/02/2023 10:29    Procedures Procedures    Medications Ordered in ED Medications  sodium phosphate (FLEET) enema 1 enema (has no administration in time range)  magnesium citrate solution 1 Bottle (has no administration in time range)    ED Course/ Medical Decision Making/ A&P                                 Medical Decision Making Amount and/or Complexity of Data Reviewed Labs: ordered. Radiology: ordered.  Risk OTC drugs.   This patient presents to the ED with chief complaint(s) of constipation, LLQ abd pain and chest pain  with pertinent past medical history of diverticulosis, cholecystectomy/appendectomy. The complaint involves an extensive differential diagnosis and also carries with it a high risk of complications and morbidity.    The differential diagnosis includes: Constipation, diverticulitis or complication from it, tumor, acs, sbo.   The initial plan is to get basic labs and CT, UA   Additional history obtained: Records reviewed Primary Care Documents and previous CT scan from 2020 that showed diverticulosis.  Independent labs interpretation:  The following labs were independently interpreted: UA is normal.  Initial troponin is normal.  BMP is normal.  Independent visualization and interpretation of imaging: - I independently visualized the following imaging with scope of interpretation limited to determining acute life threatening conditions related to emergency care: CT scan of the abdomen, which revealed no evidence of severe obstruction.  Per radiologist, there is right-sided renal cyst and constipation.  Renal cyst diagnosis discussed with the patient.  She will discuss it with her PCP.  She will receive Fleet enema at this time along with mag citrate.  Patient stable for discharge thereafter.  Final Clinical Impression(s) / ED Diagnoses Final diagnoses:  Slow transit constipation  Renal cyst, right    Rx / DC Orders ED Discharge Orders     None         Derwood Kaplan, MD 01/02/23 1318

## 2023-01-02 NOTE — ED Notes (Signed)
Patient transported to CT 

## 2023-01-02 NOTE — ED Notes (Signed)
Pt ambulated to the BR without assistance. 

## 2023-01-07 ENCOUNTER — Other Ambulatory Visit (HOSPITAL_COMMUNITY): Payer: Self-pay | Admitting: Internal Medicine

## 2023-01-07 DIAGNOSIS — N281 Cyst of kidney, acquired: Secondary | ICD-10-CM | POA: Diagnosis not present

## 2023-01-07 DIAGNOSIS — Z713 Dietary counseling and surveillance: Secondary | ICD-10-CM | POA: Diagnosis not present

## 2023-01-07 DIAGNOSIS — E871 Hypo-osmolality and hyponatremia: Secondary | ICD-10-CM | POA: Diagnosis not present

## 2023-01-07 DIAGNOSIS — K5901 Slow transit constipation: Secondary | ICD-10-CM | POA: Diagnosis not present

## 2023-01-07 DIAGNOSIS — D649 Anemia, unspecified: Secondary | ICD-10-CM | POA: Diagnosis not present

## 2023-01-17 ENCOUNTER — Ambulatory Visit (HOSPITAL_COMMUNITY)
Admission: RE | Admit: 2023-01-17 | Discharge: 2023-01-17 | Disposition: A | Discharge status: Home / Self Care | Payer: Medicare Other | Source: Ambulatory Visit | Attending: Internal Medicine | Admitting: Internal Medicine

## 2023-01-17 DIAGNOSIS — K449 Diaphragmatic hernia without obstruction or gangrene: Secondary | ICD-10-CM | POA: Diagnosis not present

## 2023-01-17 DIAGNOSIS — K5730 Diverticulosis of large intestine without perforation or abscess without bleeding: Secondary | ICD-10-CM | POA: Diagnosis not present

## 2023-01-17 DIAGNOSIS — N281 Cyst of kidney, acquired: Secondary | ICD-10-CM | POA: Diagnosis not present

## 2023-01-17 DIAGNOSIS — D1803 Hemangioma of intra-abdominal structures: Secondary | ICD-10-CM | POA: Diagnosis not present

## 2023-01-17 DIAGNOSIS — N289 Disorder of kidney and ureter, unspecified: Secondary | ICD-10-CM | POA: Diagnosis not present

## 2023-01-17 MED ORDER — GADOBUTROL 1 MMOL/ML IV SOLN
9.0000 mL | Freq: Once | INTRAVENOUS | Status: AC | PRN
  Administered 2023-01-17: 9 mL via INTRAVENOUS

## 2023-01-24 DIAGNOSIS — Z713 Dietary counseling and surveillance: Secondary | ICD-10-CM | POA: Diagnosis not present

## 2023-01-24 DIAGNOSIS — Z79899 Other long term (current) drug therapy: Secondary | ICD-10-CM | POA: Diagnosis not present

## 2023-01-24 DIAGNOSIS — N281 Cyst of kidney, acquired: Secondary | ICD-10-CM | POA: Diagnosis not present

## 2023-01-24 DIAGNOSIS — Z87891 Personal history of nicotine dependence: Secondary | ICD-10-CM | POA: Diagnosis not present

## 2023-01-24 DIAGNOSIS — I10 Essential (primary) hypertension: Secondary | ICD-10-CM | POA: Diagnosis not present

## 2023-02-04 ENCOUNTER — Ambulatory Visit (INDEPENDENT_AMBULATORY_CARE_PROVIDER_SITE_OTHER): Payer: Medicare Other | Admitting: Urology

## 2023-02-04 ENCOUNTER — Encounter: Payer: Self-pay | Admitting: Urology

## 2023-02-04 VITALS — BP 127/58 | HR 66

## 2023-02-04 DIAGNOSIS — N2889 Other specified disorders of kidney and ureter: Secondary | ICD-10-CM | POA: Diagnosis not present

## 2023-02-04 LAB — URINALYSIS, ROUTINE W REFLEX MICROSCOPIC
Bilirubin, UA: NEGATIVE
Glucose, UA: NEGATIVE
Ketones, UA: NEGATIVE
Nitrite, UA: NEGATIVE
Protein,UA: NEGATIVE
RBC, UA: NEGATIVE
Specific Gravity, UA: <1.005 — ABNORMAL LOW (ref 1.005–1.030)
Urobilinogen, Ur: 0.2 mg/dL (ref 0.2–1.0)
pH, UA: 6.5 (ref 5.0–7.5)

## 2023-02-04 LAB — MICROSCOPIC EXAMINATION

## 2023-02-04 NOTE — Progress Notes (Unsigned)
02/04/2023 9:40 AM   Phyllis Santos [birth year] [id number]  Referring provider: Benita Stabile, MD [address] [place],  [place] [address]  Renal mass   HPI: Phyllis Santos is a 82yo here for evaluation of a renal mass. She underwent MRI 01/17/2023 which showed a 1.6cm right exophytic posterior renal mass. The mass was 1.6cm in November 2020. She has intermittent left flank pain for the past 2 years.    PMH: Past Medical History:  Diagnosis Date   Hypercholesterolemia    Hypertension     Surgical History: Past Surgical History:  Procedure Laterality Date   APPENDECTOMY     Dr Fae Pippin   CARPAL TUNNEL RELEASE     right-Dr Hilda Lias   CATARACT EXTRACTION [facility]  08/16/2011   Procedure: CATARACT EXTRACTION PHACO AND INTRAOCULAR LENS PLACEMENT (IOC);  Surgeon: Gemma Payor, MD;  Location: [facility] ORS;  Service: Ophthalmology;  Laterality: Left;  CDE: 12.45   CATARACT EXTRACTION W/PHACO  09/03/2011   Procedure: CATARACT EXTRACTION PHACO AND INTRAOCULAR LENS PLACEMENT (IOC);  Surgeon: Gemma Payor, MD;  Location: [facility];  Service: Ophthalmology;  Laterality: Right;  CDE:13.47   CHOLECYSTECTOMY     [facility]   COLONOSCOPY N/A 05/18/2016   one 7 mm polyp s/p removal, ond 5 mm polyp from rectum, one polyp from transverse colon. Diverticulosis in sigmoid, descending, and transverse colon. Tubular adenomas and hyperplastic.   COLONOSCOPY WITH PROPOFOL N/A 11/01/2021   Procedure: COLONOSCOPY WITH PROPOFOL;  Surgeon: Corbin Ade, MD;  Location: [facility] ENDO SUITE;  Service: Endoscopy;  Laterality: N/A;  1:45pm   CYST EXCISION Right 03/01/2021   Procedure: EXCISION CYST, CHIN;  Surgeon: Lucretia Roers, MD;  Location: [facility] ORS;  Service: General;  Laterality: Right;   DILATION AND CURETTAGE OF UTERUS     Dr Despina Hidden 3-4 yrs ago   EXCISION OF BACK LESION Right 03/01/2021   Procedure: EXCISION OF BACK CYST;  Surgeon: Lucretia Roers, MD;  Location: [facility];  Service: General;  Laterality: Right;    TUBAL LIGATION     Dr Fae Pippin    Home Medications:  Allergies as of 02/04/2023       Reactions   Penicillins Anaphylaxis, Rash   Rash around neck Has patient had a PCN reaction causing immediate rash, facial/tongue/throat swelling, SOB or lightheadedness with hypotension: {yes Has patient had a PCN reaction causing severe rash involving mucus membranes or skin necrosis: {yes Has patient had a PCN reaction that required hospitalization {no Has patient had a PCN reaction occurring within the last 10 years: {no If all of the above answers are "NO", then may proceed with Cephalosporin use.   Codeine Other (See Comments)   Spasms in stomach   Levaquin [levofloxacin In D5w] Other (See Comments)   Numbness in arms        Medication List        Accurate as of February 04, 2023  9:40 AM. If you have any questions, ask your nurse or doctor.          acetaminophen 500 MG tablet Commonly known as: TYLENOL Take 500 mg by mouth every 6 (six) hours as needed for moderate pain.   ascorbic acid 500 MG tablet Commonly known as: VITAMIN C Take 500 mg by mouth daily.   aspirin EC 81 MG tablet Take 81 mg by mouth daily.   atorvastatin 10 MG tablet Commonly known as: LIPITOR Take 10 mg by mouth daily.   Calcium Carbonate-Vitamin D 600-400 MG-UNIT  tablet Take 1 tablet by mouth daily.   hydrochlorothiazide 25 MG tablet Commonly known as: HYDRODIURIL Take 25 mg by mouth daily.   HYDROcodone-acetaminophen 5-325 MG tablet Commonly known as: NORCO/VICODIN Take 0.5-1 tablets by mouth every 4 (four) hours as needed.   losartan 100 MG tablet Commonly known as: COZAAR Take 100 mg by mouth daily.   polyethylene glycol-electrolytes 420 g solution Commonly known as: NuLYTELY As directed   psyllium 58.6 % powder Commonly known as: METAMUCIL Take 1 packet by mouth daily. 5 ml   sennosides-docusate sodium 8.6-50 MG tablet Commonly known as: SENOKOT-S Take 1 tablet by mouth at  bedtime.   SIMILASAN ALLERGY EYE RELIEF OP Place 1 drop into both eyes daily as needed (watery eyes).   Vitamin D 125 MCG (5000 UT) Caps Take 5,000 Units by mouth daily.        Allergies:  Allergies  Allergen Reactions   Penicillins Anaphylaxis and Rash    Rash around neck  Has patient had a PCN reaction causing immediate rash, facial/tongue/throat swelling, SOB or lightheadedness with hypotension: {yes Has patient had a PCN reaction causing severe rash involving mucus membranes or skin necrosis: {yes Has patient had a PCN reaction that required hospitalization {no Has patient had a PCN reaction occurring within the last 10 years: {no If all of the above answers are "NO", then may proceed with Cephalosporin use.   Codeine Other (See Comments)    Spasms in stomach   Levaquin [Levofloxacin In D5w] Other (See Comments)    Numbness in arms    Family History: Family History  Problem Relation Age of Onset   Colon cancer Brother 26       Still alive, in his eighties   Anesthesia problems Neg Hx    Hypotension Neg Hx    Malignant hyperthermia Neg Hx    Pseudochol deficiency Neg Hx     Social History:  reports that she quit smoking about 28 years ago. Her smoking use included cigarettes. She started smoking about 38 years ago. She has a 5 pack-year smoking history. She has never used smokeless tobacco. She reports that she does not drink alcohol and does not use drugs.  ROS: All other review of systems were reviewed and are negative except what is noted above in HPI  Physical Exam: BP (!) 127/58   Pulse 66   Constitutional:  Alert and oriented, No acute distress. HEENT: [place] AT, moist mucus membranes.  Trachea midline, no masses. Cardiovascular: No clubbing, cyanosis, or edema. Respiratory: Normal respiratory effort, no increased work of breathing. GI: Abdomen is soft, nontender, nondistended, no abdominal masses GU: No CVA tenderness.  Lymph: No cervical or inguinal  lymphadenopathy. Skin: No rashes, bruises or suspicious lesions. Neurologic: Grossly intact, no focal deficits, moving all 4 extremities. Psychiatric: Normal mood and affect.  Laboratory Data: Lab Results  Component Value Date   WBC 9.1 01/02/2023   HGB 10.6 (L) 01/02/2023   HCT 32.6 (L) 01/02/2023   MCV 87.9 01/02/2023   PLT 276 01/02/2023    Lab Results  Component Value Date   CREATININE 0.65 01/02/2023    No results found for: "PSA"  No results found for: "TESTOSTERONE"  No results found for: "HGBA1C"  Urinalysis    Component Value Date/Time   COLORURINE STRAW (A) 01/02/2023 0952   APPEARANCEUR CLEAR 01/02/2023 0952   LABSPEC 1.009 01/02/2023 0952   PHURINE 8.0 01/02/2023 0952   GLUCOSEU NEGATIVE 01/02/2023 0952   HGBUR NEGATIVE 01/02/2023 5188  BILIRUBINUR NEGATIVE 01/02/2023 0952   KETONESUR NEGATIVE 01/02/2023 0952   PROTEINUR NEGATIVE 01/02/2023 0952   NITRITE NEGATIVE 01/02/2023 0952   LEUKOCYTESUR NEGATIVE 01/02/2023 0952    No results found for: "LABMICR", "WBCUA", "RBCUA", "LABEPIT", "MUCUS", "BACTERIA"  Pertinent Imaging: MRI 01/17/2023: Images reviewed and discussed with the patient No results found for this or any previous visit.  No results found for this or any previous visit.  No results found for this or any previous visit.  No results found for this or any previous visit.  No results found for this or any previous visit.  No valid procedures specified. No results found for this or any previous visit.  No results found for this or any previous visit.   Assessment & Plan:    1. Renal mass We discussed the natural hx of renal masses and the 80/20 malignant/benign likelihood. We disucssed the treatment options including active surveillance. Renal ablation, partial and radical nephrectomy. After discussing the options we have elected to proceed with surveillance. Followup 6 months with a renal US - Urinalysis, Routine w reflex  microscopic   No follow-ups on file.  Wilkie Aye, MD  [facility] [place]

## 2023-02-04 NOTE — Patient Instructions (Signed)

## 2023-03-18 ENCOUNTER — Encounter (HOSPITAL_COMMUNITY): Payer: Self-pay

## 2023-03-18 ENCOUNTER — Ambulatory Visit (HOSPITAL_COMMUNITY)
Admission: RE | Admit: 2023-03-18 | Discharge: 2023-03-18 | Disposition: A | Discharge status: Home / Self Care | Payer: Medicare Other | Source: Ambulatory Visit | Attending: Internal Medicine | Admitting: Internal Medicine

## 2023-03-18 ENCOUNTER — Other Ambulatory Visit (HOSPITAL_COMMUNITY): Payer: Self-pay | Admitting: Internal Medicine

## 2023-03-18 DIAGNOSIS — Z1231 Encounter for screening mammogram for malignant neoplasm of breast: Secondary | ICD-10-CM | POA: Insufficient documentation

## 2023-06-12 IMAGING — DX DG CHEST 1V PORT
1 series · 1 of 1 positions shown · non-contrast
Comparison: Chest x-ray 07/24/2004.

CLINICAL DATA: 80-year-old female with history of cough and
congestion for 1 week with intermittent low-grade fever.

EXAM:
PORTABLE CHEST 1 VIEW

[chest ap]
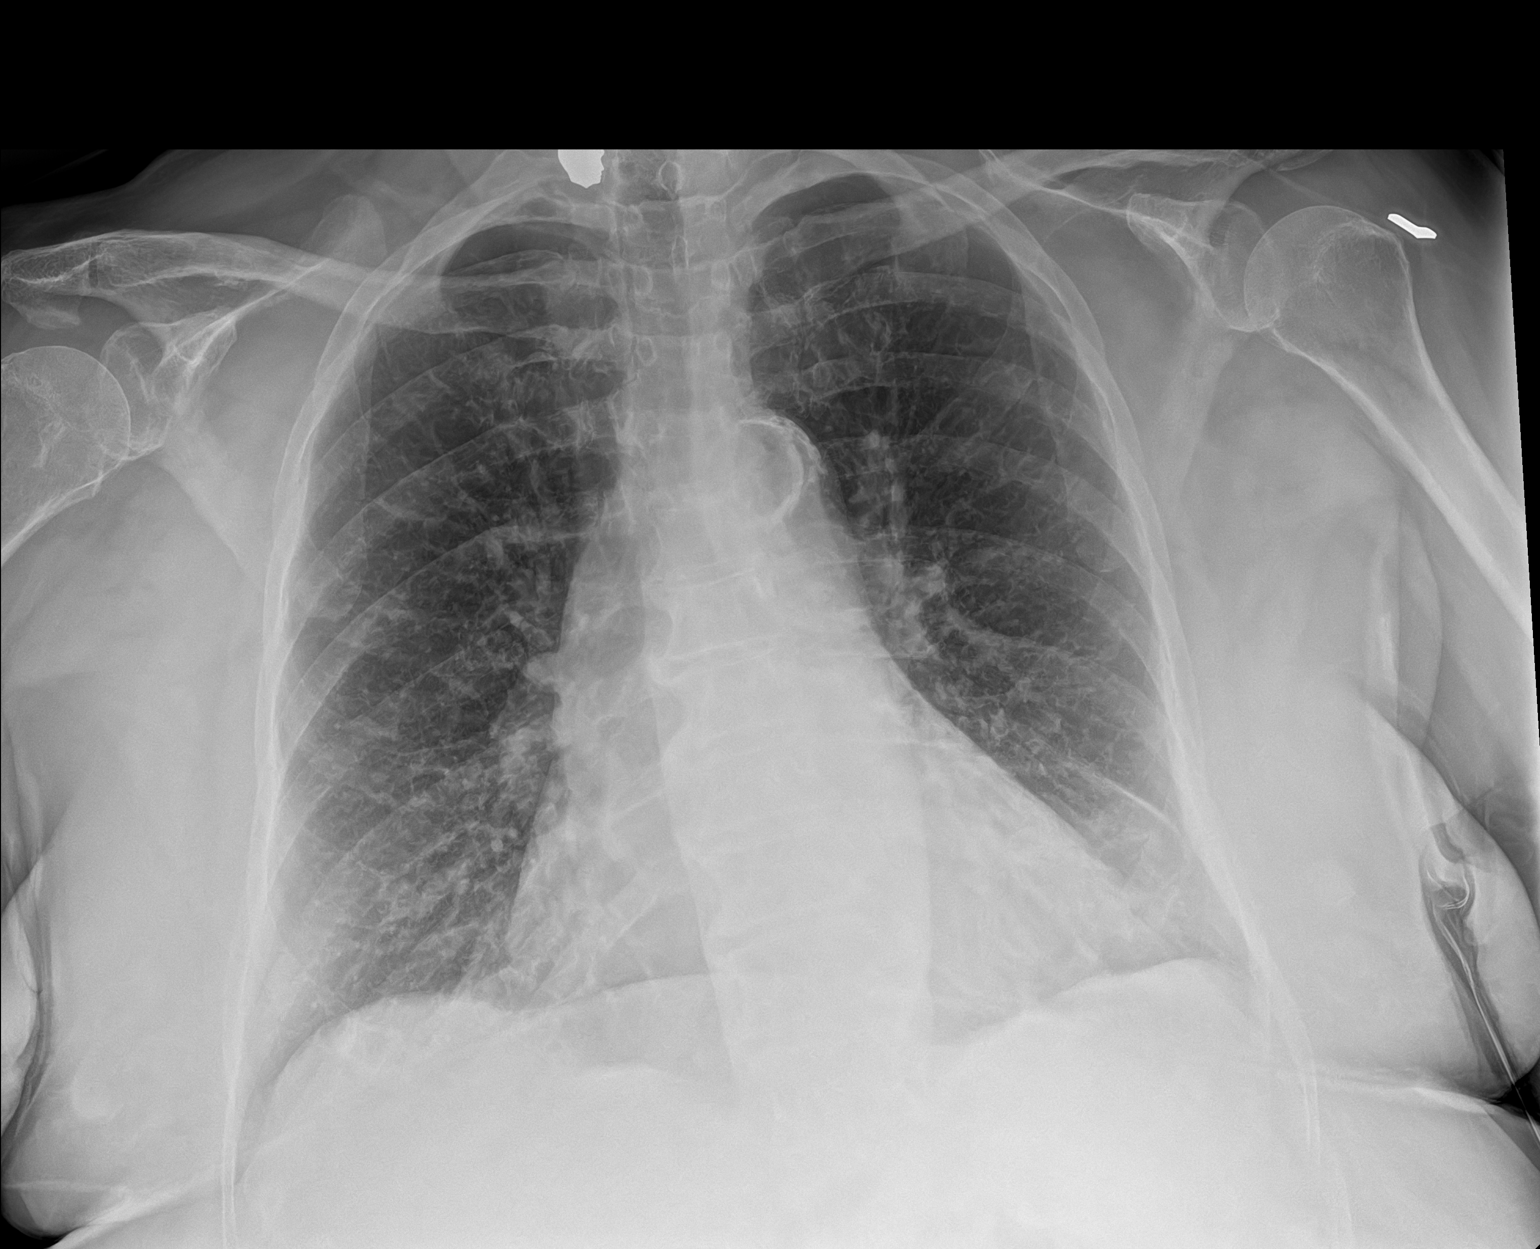

[1 of 1 positions shown; findings below may reference images not displayed]

FINDINGS: Lung volumes are normal. No consolidative airspace disease. No
pleural effusions. No pneumothorax. No pulmonary nodule or mass
noted. Pulmonary vasculature is normal. Heart size is mildly
enlarged. Upper mediastinal contours are within normal limits.
Atherosclerosis in the thoracic aorta.
IMPRESSION: 1.  No radiographic evidence of acute cardiopulmonary disease.
2. Aortic atherosclerosis.
3. Mild cardiomegaly.

## 2023-07-22 ENCOUNTER — Ambulatory Visit (HOSPITAL_COMMUNITY)
Admission: RE | Admit: 2023-07-22 | Discharge: 2023-07-22 | Disposition: A | Discharge status: Home / Self Care | Payer: Medicare Other | Source: Ambulatory Visit | Attending: Urology | Admitting: Urology

## 2023-07-22 DIAGNOSIS — N2889 Other specified disorders of kidney and ureter: Secondary | ICD-10-CM | POA: Diagnosis present

## 2023-08-05 ENCOUNTER — Ambulatory Visit: Payer: Medicare Other | Admitting: Urology

## 2023-08-05 VITALS — BP 143/63 | HR 58

## 2023-08-05 DIAGNOSIS — N2889 Other specified disorders of kidney and ureter: Secondary | ICD-10-CM | POA: Diagnosis not present

## 2023-08-05 LAB — URINALYSIS, ROUTINE W REFLEX MICROSCOPIC
Bilirubin, UA: NEGATIVE
Glucose, UA: NEGATIVE
Ketones, UA: NEGATIVE
Leukocytes,UA: NEGATIVE
Nitrite, UA: NEGATIVE
Protein,UA: NEGATIVE
RBC, UA: NEGATIVE
Specific Gravity, UA: 1.01 (ref 1.005–1.030)
Urobilinogen, Ur: 0.2 mg/dL (ref 0.2–1.0)
pH, UA: 7.5 (ref 5.0–7.5)

## 2023-08-05 NOTE — Progress Notes (Unsigned)
 08/05/2023 2:30 PM   Phyllis Santos 12/09/1940 [id number]  Referring provider: Benita Stabile, MD [address] [place],  [place] [address]  Right renal mass   HPI: Phyllis Santos is a 82yo here for followup for right renal mass. Mass was found to be 2.3cm on renal US. She has occasional urinary incontinence for which she wears pads. She uses 1-2 pads.    PMH: Past Medical History:  Diagnosis Date   Hypercholesterolemia    Hypertension     Surgical History: Past Surgical History:  Procedure Laterality Date   APPENDECTOMY     Dr Fae Pippin   CARPAL TUNNEL RELEASE     right-Dr Hilda Lias   CATARACT EXTRACTION [facility]  08/16/2011   Procedure: CATARACT EXTRACTION PHACO AND INTRAOCULAR LENS PLACEMENT (IOC);  Surgeon: Gemma Payor, MD;  Location: [facility];  Service: Ophthalmology;  Laterality: Left;  CDE: 12.45   CATARACT EXTRACTION W/PHACO  09/03/2011   Procedure: CATARACT EXTRACTION PHACO AND INTRAOCULAR LENS PLACEMENT (IOC);  Surgeon: Gemma Payor, MD;  Location: [facility];  Service: Ophthalmology;  Laterality: Right;  CDE:13.47   CHOLECYSTECTOMY     [facility]   COLONOSCOPY N/A 05/18/2016   one 7 mm polyp s/p removal, ond 5 mm polyp from rectum, one polyp from transverse colon. Diverticulosis in sigmoid, descending, and transverse colon. Tubular adenomas and hyperplastic.   COLONOSCOPY WITH PROPOFOL N/A 11/01/2021   Procedure: COLONOSCOPY WITH PROPOFOL;  Surgeon: Corbin Ade, MD;  Location: [facility];  Service: Endoscopy;  Laterality: N/A;  1:45pm   CYST EXCISION Right 03/01/2021   Procedure: EXCISION CYST, CHIN;  Surgeon: Lucretia Roers, MD;  Location: [facility];  Service: General;  Laterality: Right;   DILATION AND CURETTAGE OF UTERUS     Dr Despina Hidden 3-4 yrs ago   EXCISION OF BACK LESION Right 03/01/2021   Procedure: EXCISION OF BACK CYST;  Surgeon: Lucretia Roers, MD;  Location: [facility];  Service: General;  Laterality: Right;   TUBAL LIGATION     Dr Fae Pippin    Home  Medications:  Allergies as of 08/05/2023       Reactions   Penicillins Anaphylaxis, Rash   Rash around neck Has patient had a PCN reaction causing immediate rash, facial/tongue/throat swelling, SOB or lightheadedness with hypotension: {yes Has patient had a PCN reaction causing severe rash involving mucus membranes or skin necrosis: {yes Has patient had a PCN reaction that required hospitalization {no Has patient had a PCN reaction occurring within the last 10 years: {no If all of the above answers are "NO", then may proceed with Cephalosporin use.   Codeine Other (See Comments)   Spasms in stomach   Levaquin [levofloxacin In D5w] Other (See Comments)   Numbness in arms        Medication List        Accurate as of August 05, 2023  2:30 PM. If you have any questions, ask your nurse or doctor.          acetaminophen 500 MG tablet Commonly known as: TYLENOL Take 500 mg by mouth every 6 (six) hours as needed for moderate pain.   ascorbic acid 500 MG tablet Commonly known as: VITAMIN C Take 500 mg by mouth daily.   aspirin EC 81 MG tablet Take 81 mg by mouth daily.   atorvastatin 10 MG tablet Commonly known as: LIPITOR Take 10 mg by mouth daily.   Calcium Carbonate-Vitamin D 600-400 MG-UNIT tablet Take 1 tablet by mouth daily.  hydrochlorothiazide 25 MG tablet Commonly known as: HYDRODIURIL Take 25 mg by mouth daily.   HYDROcodone-acetaminophen 5-325 MG tablet Commonly known as: NORCO/VICODIN Take 0.5-1 tablets by mouth every 4 (four) hours as needed.   losartan 100 MG tablet Commonly known as: COZAAR Take 100 mg by mouth daily.   polyethylene glycol-electrolytes 420 g solution Commonly known as: NuLYTELY As directed   psyllium 58.6 % powder Commonly known as: METAMUCIL Take 1 packet by mouth daily. 5 ml   sennosides-docusate sodium 8.6-50 MG tablet Commonly known as: SENOKOT-S Take 1 tablet by mouth at bedtime.   SIMILASAN ALLERGY EYE RELIEF OP Place  1 drop into both eyes daily as needed (watery eyes).   Vitamin D 125 MCG (5000 UT) Caps Take 5,000 Units by mouth daily.        Allergies:  Allergies  Allergen Reactions   Penicillins Anaphylaxis and Rash    Rash around neck  Has patient had a PCN reaction causing immediate rash, facial/tongue/throat swelling, SOB or lightheadedness with hypotension: {yes Has patient had a PCN reaction causing severe rash involving mucus membranes or skin necrosis: {yes Has patient had a PCN reaction that required hospitalization {no Has patient had a PCN reaction occurring within the last 10 years: {no If all of the above answers are "NO", then may proceed with Cephalosporin use.   Codeine Other (See Comments)    Spasms in stomach   Levaquin [Levofloxacin In D5w] Other (See Comments)    Numbness in arms    Family History: Family History  Problem Relation Age of Onset   Colon cancer Brother 73       Still alive, in his eighties   Anesthesia problems Neg Hx    Hypotension Neg Hx    Malignant hyperthermia Neg Hx    Pseudochol deficiency Neg Hx     Social History:  reports that she quit smoking about 29 years ago. Her smoking use included cigarettes. She started smoking about 39 years ago. She has a 5 pack-year smoking history. She has never used smokeless tobacco. She reports that she does not drink alcohol and does not use drugs.  ROS: All other review of systems were reviewed and are negative except what is noted above in HPI  Physical Exam: BP (!) 143/63   Pulse (!) 58   Constitutional:  Alert and oriented, No acute distress. HEENT: [place] AT, moist mucus membranes.  Trachea midline, no masses. Cardiovascular: No clubbing, cyanosis, or edema. Respiratory: Normal respiratory effort, no increased work of breathing. GI: Abdomen is soft, nontender, nondistended, no abdominal masses GU: No CVA tenderness.  Lymph: No cervical or inguinal lymphadenopathy. Skin: No rashes, bruises or  suspicious lesions. Neurologic: Grossly intact, no focal deficits, moving all 4 extremities. Psychiatric: Normal mood and affect.  Laboratory Data: Lab Results  Component Value Date   WBC 9.1 01/02/2023   HGB 10.6 (L) 01/02/2023   HCT 32.6 (L) 01/02/2023   MCV 87.9 01/02/2023   PLT 276 01/02/2023    Lab Results  Component Value Date   CREATININE 0.65 01/02/2023    No results found for: "PSA"  No results found for: "TESTOSTERONE"  No results found for: "HGBA1C"  Urinalysis    Component Value Date/Time   COLORURINE STRAW (A) 01/02/2023 0952   APPEARANCEUR Clear 02/04/2023 0914   LABSPEC 1.009 01/02/2023 0952   PHURINE 8.0 01/02/2023 0952   GLUCOSEU Negative 02/04/2023 0914   HGBUR NEGATIVE 01/02/2023 0952   BILIRUBINUR Negative 02/04/2023 0914   KETONESUR  NEGATIVE 01/02/2023 0952   PROTEINUR Negative 02/04/2023 0914   PROTEINUR NEGATIVE 01/02/2023 0952   NITRITE Negative 02/04/2023 0914   NITRITE NEGATIVE 01/02/2023 0952   LEUKOCYTESUR Trace (A) 02/04/2023 0914   LEUKOCYTESUR NEGATIVE 01/02/2023 0952    Lab Results  Component Value Date   LABMICR See below: 02/04/2023   WBCUA 6-10 (A) 02/04/2023   LABEPIT 0-10 02/04/2023   BACTERIA Few (A) 02/04/2023    Pertinent Imaging: *** No results found for this or any previous visit.  No results found for this or any previous visit.  No results found for this or any previous visit.  No results found for this or any previous visit.  Results for orders placed during the hospital encounter of 07/22/23  US RENAL  Narrative CLINICAL DATA:  Follow-up renal mass  EXAM: RENAL / URINARY TRACT ULTRASOUND COMPLETE  COMPARISON:  MR abdomen 01/17/2023  FINDINGS: Right Kidney:  Renal measurements: 8.9 x 4.9 x 5.9 cm = volume: 135 mL. Normal renal cortical thickness and echogenicity. No hydronephrosis. 2.3 x 1.9 x 1.9 cm hypoechoic mass medial right kidney. This measured 1.6 x 1.3 cm on prior MRI.  Left  Kidney:  Renal measurements: 11.0 x 5.5 x 6.7 cm = volume: 212 mL. Echogenicity within normal limits. No mass or hydronephrosis visualized.  Bladder:  Appears normal for degree of bladder distention.  Other:  Prevoid: 651 cc  Postvoid: 0 cc  IMPRESSION: 2.3 cm hypoechoic mass medial right kidney. This measured 1.6 cm on prior MRI. 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   Electronically Signed By: Annia Belt M.D. On: 08/04/2023 21:54  No results found for this or any previous visit.  No results found for this or any previous visit.  No results found for this or any previous visit.   Assessment & Plan:    1. Renal mass (Primary) *** - Urinalysis, Routine w reflex microscopic   No follow-ups on file.  Wilkie Aye, MD  [facility] [place]

## 2023-08-09 ENCOUNTER — Encounter: Payer: Self-pay | Admitting: Urology

## 2023-08-09 NOTE — Patient Instructions (Signed)
 A Growth in the Kidney (Renal Mass): What to Know  A renal mass is an abnormal growth in the kidney. It may be found during an MRI, CT scan, or ultrasound that's done to check for other problems in the belly. Some renal masses are cancerous, or malignant, and can grow or spread quickly. Others are benign, which means they're not cancer. Renal masses include: Tumors. These may be either cancerous or benign. The most common cancerous tumor in adults is called renal cell carcinoma. In children, the most common type is Wilms tumor. The most common kidney tumors that aren't cancer include renal adenomas, oncocytomas, and angiomyolipomas (AML). Cysts. These are pockets of fluid that form on or in the kidney. What are the causes? Certain types of cancers, infections, or injuries can cause a renal mass. It's not always known what causes a cyst to form in or on the kidney. What are the signs or symptoms? Often, a renal mass or kidney cyst doesn't cause any signs or symptoms. How is this diagnosed? Your health care provider may suggest tests to diagnose the cause of your renal mass. These tests may include: A physical exam. Blood tests. Pee (urine) tests. Imaging tests, such as: CT scan. MRI. Ultrasound. Chest X-ray or bone scan. These may be done if a tumor is cancerous to see if the cancer has spread outside the kidney. Biopsy. This is when a small piece of tissue is removed from the renal mass for testing. How is this treated? Treatment is not always needed for a renal mass. Treatment will depend on the cause of the mass and if it's causing any problems or symptoms. For a cancerous renal mass, treatment options may include: Surgery. This is done to remove the tumor and any affected tissue. Chemotherapy. This uses medicines to kill cancer cells. Radiation. High-energy X-rays or gamma rays are used to kill cancer cells. Ablation. This uses extreme hot or cold temperature to kill the cancer  cells. Immunotherapy. Medicines are used to help the body's defense system (immune system) fight the cancer cells. Taking part in clinical trials. This involves trying new or experimental treatments to see if they're effective. Most kidney cysts don't need to be treated. Follow these instructions at home: What you need to do at home will depend on the cause of the mass. Follow the instructions that your provider gives you. In general: Take medicines only as told. If you were given antibiotics, take them as told. Do not stop taking them even if you start to feel better. Follow any instructions from your provider about what you can and can't do. Do not smoke, vape, or use nicotine or tobacco. Keep all follow-up visits. Your provider will need to check if your renal mass has changed or grown. Contact a health care provider if: You have flank pain, which is pain in your side or back. You have a fever. You have a loss of appetite. You have pain or swelling in your belly. You lose weight. Get help right away if: Your pain gets worse. There's blood in your pee. You can't pee. This information is not intended to replace advice given to you by your health care provider. Make sure you discuss any questions you have with your health care provider. Document Revised: 10/26/2022 Document Reviewed: 10/26/2022 Elsevier Patient Education  2024 [employer].

## 2023-11-20 DIAGNOSIS — M79605 Pain in left leg: Secondary | ICD-10-CM | POA: Diagnosis not present

## 2023-11-25 ENCOUNTER — Other Ambulatory Visit (HOSPITAL_COMMUNITY): Payer: Self-pay | Admitting: Nurse Practitioner

## 2023-11-25 ENCOUNTER — Ambulatory Visit (HOSPITAL_COMMUNITY)
Admission: RE | Admit: 2023-11-25 | Discharge: 2023-11-25 | Disposition: A | Discharge status: Home / Self Care | Source: Ambulatory Visit | Attending: Nurse Practitioner | Admitting: Nurse Practitioner

## 2023-11-25 DIAGNOSIS — M79605 Pain in left leg: Secondary | ICD-10-CM | POA: Insufficient documentation

## 2023-12-06 DIAGNOSIS — R7301 Impaired fasting glucose: Secondary | ICD-10-CM | POA: Diagnosis not present

## 2023-12-06 DIAGNOSIS — M8580 Other specified disorders of bone density and structure, unspecified site: Secondary | ICD-10-CM | POA: Diagnosis not present

## 2023-12-06 DIAGNOSIS — E782 Mixed hyperlipidemia: Secondary | ICD-10-CM | POA: Diagnosis not present

## 2023-12-12 DIAGNOSIS — I10 Essential (primary) hypertension: Secondary | ICD-10-CM | POA: Diagnosis not present

## 2023-12-12 DIAGNOSIS — R7301 Impaired fasting glucose: Secondary | ICD-10-CM | POA: Diagnosis not present

## 2023-12-12 DIAGNOSIS — K5909 Other constipation: Secondary | ICD-10-CM | POA: Diagnosis not present

## 2023-12-12 DIAGNOSIS — D649 Anemia, unspecified: Secondary | ICD-10-CM | POA: Diagnosis not present

## 2023-12-12 DIAGNOSIS — Z0000 Encounter for general adult medical examination without abnormal findings: Secondary | ICD-10-CM | POA: Diagnosis not present

## 2023-12-12 DIAGNOSIS — M8580 Other specified disorders of bone density and structure, unspecified site: Secondary | ICD-10-CM | POA: Diagnosis not present

## 2023-12-12 DIAGNOSIS — E782 Mixed hyperlipidemia: Secondary | ICD-10-CM | POA: Diagnosis not present

## 2023-12-12 DIAGNOSIS — N281 Cyst of kidney, acquired: Secondary | ICD-10-CM | POA: Diagnosis not present

## 2023-12-12 DIAGNOSIS — M5441 Lumbago with sciatica, right side: Secondary | ICD-10-CM | POA: Diagnosis not present

## 2023-12-12 DIAGNOSIS — Z23 Encounter for immunization: Secondary | ICD-10-CM | POA: Diagnosis not present

## 2024-01-29 ENCOUNTER — Ambulatory Visit (HOSPITAL_COMMUNITY)
Admission: RE | Admit: 2024-01-29 | Discharge: 2024-01-29 | Disposition: A | Discharge status: Home / Self Care | Source: Ambulatory Visit | Attending: Urology | Admitting: Urology

## 2024-01-29 DIAGNOSIS — N2889 Other specified disorders of kidney and ureter: Secondary | ICD-10-CM | POA: Diagnosis not present

## 2024-02-05 ENCOUNTER — Ambulatory Visit: Admitting: Urology

## 2024-02-05 ENCOUNTER — Encounter: Payer: Self-pay | Admitting: Urology

## 2024-02-05 VITALS — BP 108/64 | HR 55

## 2024-02-05 DIAGNOSIS — N2889 Other specified disorders of kidney and ureter: Secondary | ICD-10-CM

## 2024-02-05 LAB — URINALYSIS, ROUTINE W REFLEX MICROSCOPIC
Bilirubin, UA: NEGATIVE
Glucose, UA: NEGATIVE
Ketones, UA: NEGATIVE
Leukocytes,UA: NEGATIVE
Nitrite, UA: NEGATIVE
Protein,UA: NEGATIVE
RBC, UA: NEGATIVE
Specific Gravity, UA: 1.01 (ref 1.005–1.030)
Urobilinogen, Ur: 0.2 mg/dL (ref 0.2–1.0)
pH, UA: 7.5 (ref 5.0–7.5)

## 2024-02-05 NOTE — Patient Instructions (Signed)
 A Growth in the Kidney (Renal Mass): What to Know  A renal mass is an abnormal growth in the kidney. It may be found during an MRI, CT scan, or ultrasound that's done to check for other problems in the belly. Some renal masses are cancerous, or malignant, and can grow or spread quickly. Others are benign, which means they're not cancer. Renal masses include: Tumors. These may be either cancerous or benign. The most common cancerous tumor in adults is called renal cell carcinoma. In children, the most common type is Wilms tumor. The most common kidney tumors that aren't cancer include renal adenomas, oncocytomas, and angiomyolipomas (AML). Cysts. These are pockets of fluid that form on or in the kidney. What are the causes? Certain types of cancers, infections, or injuries can cause a renal mass. It's not always known what causes a cyst to form in or on the kidney. What are the signs or symptoms? Often, a renal mass or kidney cyst doesn't cause any signs or symptoms. How is this diagnosed? Your health care provider may suggest tests to diagnose the cause of your renal mass. These tests may include: A physical exam. Blood tests. Pee (urine) tests. Imaging tests, such as: CT scan. MRI. Ultrasound. Chest X-ray or bone scan. These may be done if a tumor is cancerous to see if the cancer has spread outside the kidney. Biopsy. This is when a small piece of tissue is removed from the renal mass for testing. How is this treated? Treatment is not always needed for a renal mass. Treatment will depend on the cause of the mass and if it's causing any problems or symptoms. For a cancerous renal mass, treatment options may include: Surgery. This is done to remove the tumor and any affected tissue. Chemotherapy. This uses medicines to kill cancer cells. Radiation. High-energy X-rays or gamma rays are used to kill cancer cells. Ablation. This uses extreme hot or cold temperature to kill the cancer  cells. Immunotherapy. Medicines are used to help the body's defense system (immune system) fight the cancer cells. Taking part in clinical trials. This involves trying new or experimental treatments to see if they're effective. Most kidney cysts don't need to be treated. Follow these instructions at home: What you need to do at home will depend on the cause of the mass. Follow the instructions that your provider gives you. In general: Take medicines only as told. If you were given antibiotics, take them as told. Do not stop taking them even if you start to feel better. Follow any instructions from your provider about what you can and can't do. Do not smoke, vape, or use nicotine or tobacco. Keep all follow-up visits. Your provider will need to check if your renal mass has changed or grown. Contact a health care provider if: You have flank pain, which is pain in your side or back. You have a fever. You have a loss of appetite. You have pain or swelling in your belly. You lose weight. Get help right away if: Your pain gets worse. There's blood in your pee. You can't pee. This information is not intended to replace advice given to you by your health care provider. Make sure you discuss any questions you have with your health care provider. Document Revised: 10/26/2022 Document Reviewed: 10/26/2022 Elsevier Patient Education  2025 [employer].

## 2024-02-05 NOTE — Progress Notes (Signed)
 02/05/2024 10:54 AM   Zelda VEAR Bologna [birth year] [id number]  Referring provider: Shona Norleen PEDLAR, MD [address] [place],  [place] [address]  Followup renal mass   HPI: Ms Burlison is a 83yo here for followup for a right renal mass. Renal US  shows a stable 2.6cm right renal mass. No significant LUTS. No hematuria or dysuria. No right flank pain. No other complaints today   PMH: Past Medical History:  Diagnosis Date   Hypercholesterolemia    Hypertension     Surgical History: Past Surgical History:  Procedure Laterality Date   APPENDECTOMY     Dr Howie   CARPAL TUNNEL RELEASE     right-Dr Brenna   CATARACT EXTRACTION [facility]  08/16/2011   Procedure: CATARACT EXTRACTION PHACO AND INTRAOCULAR LENS PLACEMENT (IOC);  Surgeon: Cherene Mania, MD;  Location: [facility];  Service: Ophthalmology;  Laterality: Left;  CDE: 12.45   CATARACT EXTRACTION W/PHACO  09/03/2011   Procedure: CATARACT EXTRACTION PHACO AND INTRAOCULAR LENS PLACEMENT (IOC);  Surgeon: Cherene Mania, MD;  Location: [facility];  Service: Ophthalmology;  Laterality: Right;  CDE:13.47   CHOLECYSTECTOMY     [facility]   COLONOSCOPY N/A 05/18/2016   one 7 mm polyp s/p removal, ond 5 mm polyp from rectum, one polyp from transverse colon. Diverticulosis in sigmoid, descending, and transverse colon. Tubular adenomas and hyperplastic.   COLONOSCOPY WITH PROPOFOL  N/A 11/01/2021   Procedure: COLONOSCOPY WITH PROPOFOL ;  Surgeon: Shaaron Lamar HERO, MD;  Location: [facility] SU[facility];  Service: Endoscopy;  Laterality: N/A;  1:45pm   CYST EXCISION Right 03/01/2021   Procedure: EXCISION CYST, CHIN;  Surgeon: Kallie Manuelita BROCKS, MD;  Location: [facility];  Service: General;  Laterality: Right;   DILATION AND CURETTAGE OF UTERUS     Dr Jayne 3-4 yrs ago   EXCISION OF BACK LESION Right 03/01/2021   Procedure: EXCISION OF BACK CYST;  Surgeon: Kallie Manuelita BROCKS, MD;  Location: [facility];  Service: General;  Laterality: Right;   TUBAL LIGATION     Dr  Howie    Home Medications:  Allergies as of 02/05/2024       Reactions   Penicillins Anaphylaxis, Rash   Rash around neck Has patient had a PCN reaction causing immediate rash, facial/tongue/throat swelling, SOB or lightheadedness with hypotension: {yes Has patient had a PCN reaction causing severe rash involving mucus membranes or skin necrosis: {yes Has patient had a PCN reaction that required hospitalization {no Has patient had a PCN reaction occurring within the last 10 years: {no If all of the above answers are NO, then may proceed with Cephalosporin use.   Codeine Other (See Comments)   Spasms in stomach   Levaquin [levofloxacin In D5w] Other (See Comments)   Numbness in arms        Medication List        Accurate as of February 05, 2024 10:54 AM. If you have any questions, ask your nurse or doctor.          acetaminophen  500 MG tablet Commonly known as: TYLENOL  Take 500 mg by mouth every 6 (six) hours as needed for moderate pain.   ascorbic acid 500 MG tablet Commonly known as: VITAMIN C Take 500 mg by mouth daily.   aspirin EC 81 MG tablet Take 81 mg by mouth daily.   atorvastatin 10 MG tablet Commonly known as: LIPITOR Take 10 mg by mouth daily.   Calcium Carbonate-Vitamin D 600-400 MG-UNIT tablet Take 1 tablet by mouth daily.  hydrochlorothiazide 25 MG tablet Commonly known as: HYDRODIURIL Take 25 mg by mouth daily.   HYDROcodone -acetaminophen  5-325 MG tablet Commonly known as: NORCO/VICODIN Take 0.5-1 tablets by mouth every 4 (four) hours as needed.   losartan 100 MG tablet Commonly known as: COZAAR Take 100 mg by mouth daily.   polyethylene glycol-electrolytes 420 g solution Commonly known as: NuLYTELY As directed   psyllium 58.6 % powder Commonly known as: METAMUCIL Take 1 packet by mouth daily. 5 ml   sennosides-docusate sodium 8.6-50 MG tablet Commonly known as: SENOKOT-S Take 1 tablet by mouth at bedtime.   SIMILASAN  ALLERGY EYE RELIEF OP Place 1 drop into both eyes daily as needed (watery eyes).   Vitamin D 125 MCG (5000 UT) Caps Take 5,000 Units by mouth daily.        Allergies:  Allergies  Allergen Reactions   Penicillins Anaphylaxis and Rash    Rash around neck  Has patient had a PCN reaction causing immediate rash, facial/tongue/throat swelling, SOB or lightheadedness with hypotension: {yes Has patient had a PCN reaction causing severe rash involving mucus membranes or skin necrosis: {yes Has patient had a PCN reaction that required hospitalization {no Has patient had a PCN reaction occurring within the last 10 years: {no If all of the above answers are NO, then may proceed with Cephalosporin use.   Codeine Other (See Comments)    Spasms in stomach   Levaquin [Levofloxacin In D5w] Other (See Comments)    Numbness in arms    Family History: Family History  Problem Relation Age of Onset   Colon cancer Brother 39       Still alive, in his eighties   Anesthesia problems Neg Hx    Hypotension Neg Hx    Malignant hyperthermia Neg Hx    Pseudochol deficiency Neg Hx     Social History:  reports that she quit smoking about 29 years ago. Her smoking use included cigarettes. She started smoking about 39 years ago. She has a 5 pack-year smoking history. She has never used smokeless tobacco. She reports that she does not drink alcohol and does not use drugs.  ROS: All other review of systems were reviewed and are negative except what is noted above in HPI  Physical Exam: There were no vitals taken for this visit.  Constitutional:  Alert and oriented, No acute distress. HEENT: [place] AT, moist mucus membranes.  Trachea midline, no masses. Cardiovascular: No clubbing, cyanosis, or edema. Respiratory: Normal respiratory effort, no increased work of breathing. GI: Abdomen is soft, nontender, nondistended, no abdominal masses GU: No CVA tenderness.  Lymph: No cervical or inguinal  lymphadenopathy. Skin: No rashes, bruises or suspicious lesions. Neurologic: Grossly intact, no focal deficits, moving all 4 extremities. Psychiatric: Normal mood and affect.  Laboratory Data: Lab Results  Component Value Date   WBC 9.1 01/02/2023   HGB 10.6 (L) 01/02/2023   HCT 32.6 (L) 01/02/2023   MCV 87.9 01/02/2023   PLT 276 01/02/2023    Lab Results  Component Value Date   CREATININE 0.65 01/02/2023    No results found for: PSA  No results found for: TESTOSTERONE  No results found for: HGBA1C  Urinalysis    Component Value Date/Time   COLORURINE STRAW (A) 01/02/2023 0952   APPEARANCEUR Clear 08/05/2023 1411   LABSPEC 1.009 01/02/2023 0952   PHURINE 8.0 01/02/2023 0952   GLUCOSEU Negative 08/05/2023 1411   HGBUR NEGATIVE 01/02/2023 0952   BILIRUBINUR Negative 08/05/2023 1411   KETONESUR NEGATIVE  01/02/2023 0952   PROTEINUR Negative 08/05/2023 1411   PROTEINUR NEGATIVE 01/02/2023 0952   NITRITE Negative 08/05/2023 1411   NITRITE NEGATIVE 01/02/2023 0952   LEUKOCYTESUR Negative 08/05/2023 1411   LEUKOCYTESUR NEGATIVE 01/02/2023 0952    Lab Results  Component Value Date   LABMICR Comment 08/05/2023   WBCUA 6-10 (A) 02/04/2023   LABEPIT 0-10 02/04/2023   BACTERIA Few (A) 02/04/2023    Pertinent Imaging: Renal US  01/29/24: Images reviewed and discussed with the patient  No results found for this or any previous visit.  No results found for this or any previous visit.  No results found for this or any previous visit.  No results found for this or any previous visit.  Results for orders placed during the hospital encounter of 01/29/24  Ultrasound renal complete  Narrative CLINICAL DATA:  Follow-up examination for right renal mass.  EXAM: RENAL / URINARY TRACT ULTRASOUND COMPLETE  COMPARISON:  Comparison made with prior renal ultrasound from 07/22/2023 as well as previous MRI from 01/17/2023  FINDINGS: Right Kidney:  Renal measurements:  10.3 x 4.6 x 5.6 cm = volume: 136.7 mL. Renal echogenicity within normal limits. No nephrolithiasis or hydronephrosis. 2.5 x 1.8 x 1.7 cm solid hypoechoic mass present at the medial right kidney. This measured 2.3 x 1.9 x 1.9 cm on 07/22/2023 ultrasound, but up to 1.6 cm on prior MRI from 01/17/2023. Finding again concerning for a renal cell carcinoma.  Left Kidney:  Renal measurements: 11.3 x 5.7 x 6.0 cm = volume: 199.8 mL. Renal echogenicity within normal limits. No nephrolithiasis or hydronephrosis. No focal renal mass.  Bladder:  Appears normal for degree of bladder distention.  Other:  None.  IMPRESSION: 2.5 cm solid hypoechoic mass at the medial right kidney. This measures slightly increased in size from most recent ultrasound on 07/22/2023 (up to 2.3 cm on that exam) and significantly increased in size as compared to previous MRI from 01/17/2023 (up to 1.6 cm at that time). Finding again concerning for a renal cell carcinoma.   Electronically Signed By: Morene Hoard M.D. On: 01/31/2024 12:24  No results found for this or any previous visit.  No results found for this or any previous visit.  No results found for this or any previous visit.   Assessment & Plan:    1. Renal mass (Primary) -continue surveillance. Followup 6 months with renal US  - Urinalysis, Routine w reflex microscopic   No follow-ups on file.  Belvie Clara, MD  [facility] [place]

## 2024-03-12 DIAGNOSIS — Z87891 Personal history of nicotine dependence: Secondary | ICD-10-CM | POA: Diagnosis not present

## 2024-03-12 DIAGNOSIS — J069 Acute upper respiratory infection, unspecified: Secondary | ICD-10-CM | POA: Diagnosis not present

## 2024-03-12 DIAGNOSIS — I10 Essential (primary) hypertension: Secondary | ICD-10-CM | POA: Diagnosis not present

## 2024-03-12 DIAGNOSIS — Z7182 Exercise counseling: Secondary | ICD-10-CM | POA: Diagnosis not present

## 2024-03-12 DIAGNOSIS — R059 Cough, unspecified: Secondary | ICD-10-CM | POA: Diagnosis not present

## 2024-03-12 DIAGNOSIS — N281 Cyst of kidney, acquired: Secondary | ICD-10-CM | POA: Diagnosis not present

## 2024-03-12 DIAGNOSIS — Z79899 Other long term (current) drug therapy: Secondary | ICD-10-CM | POA: Diagnosis not present

## 2024-03-12 DIAGNOSIS — Z713 Dietary counseling and surveillance: Secondary | ICD-10-CM | POA: Diagnosis not present

## 2024-03-12 DIAGNOSIS — J302 Other seasonal allergic rhinitis: Secondary | ICD-10-CM | POA: Diagnosis not present

## 2024-03-16 ENCOUNTER — Other Ambulatory Visit (HOSPITAL_COMMUNITY): Payer: Self-pay | Admitting: Internal Medicine

## 2024-03-16 DIAGNOSIS — Z1231 Encounter for screening mammogram for malignant neoplasm of breast: Secondary | ICD-10-CM

## 2024-03-19 ENCOUNTER — Encounter (HOSPITAL_COMMUNITY): Payer: Self-pay

## 2024-03-19 ENCOUNTER — Ambulatory Visit (HOSPITAL_COMMUNITY)
Admission: RE | Admit: 2024-03-19 | Discharge: 2024-03-19 | Disposition: A | Discharge status: Home / Self Care | Source: Ambulatory Visit | Attending: Internal Medicine | Admitting: Internal Medicine

## 2024-03-19 DIAGNOSIS — Z1231 Encounter for screening mammogram for malignant neoplasm of breast: Secondary | ICD-10-CM | POA: Insufficient documentation

## 2024-05-07 ENCOUNTER — Emergency Department (HOSPITAL_COMMUNITY): Admission: EM | Admit: 2024-05-07 | Discharge: 2024-05-07 | Disposition: A | Discharge status: Home / Self Care

## 2024-05-07 ENCOUNTER — Other Ambulatory Visit: Payer: Self-pay

## 2024-05-07 ENCOUNTER — Emergency Department (HOSPITAL_COMMUNITY)

## 2024-05-07 ENCOUNTER — Encounter (HOSPITAL_COMMUNITY): Payer: Self-pay

## 2024-05-07 DIAGNOSIS — Z7982 Long term (current) use of aspirin: Secondary | ICD-10-CM | POA: Insufficient documentation

## 2024-05-07 DIAGNOSIS — I10 Essential (primary) hypertension: Secondary | ICD-10-CM | POA: Diagnosis not present

## 2024-05-07 DIAGNOSIS — Z79899 Other long term (current) drug therapy: Secondary | ICD-10-CM | POA: Insufficient documentation

## 2024-05-07 DIAGNOSIS — N3090 Cystitis, unspecified without hematuria: Secondary | ICD-10-CM

## 2024-05-07 DIAGNOSIS — N3091 Cystitis, unspecified with hematuria: Secondary | ICD-10-CM | POA: Diagnosis not present

## 2024-05-07 DIAGNOSIS — R300 Dysuria: Secondary | ICD-10-CM | POA: Diagnosis present

## 2024-05-07 LAB — COMPREHENSIVE METABOLIC PANEL WITH GFR
ALT: 9 U/L (ref 0–44)
AST: 19 U/L (ref 15–41)
Albumin: 4.1 g/dL (ref 3.5–5.0)
Alkaline Phosphatase: 106 U/L (ref 38–126)
Anion gap: 13 (ref 5–15)
BUN: 16 mg/dL (ref 8–23)
CO2: 27 mmol/L (ref 22–32)
Calcium: 9.5 mg/dL (ref 8.9–10.3)
Chloride: 103 mmol/L (ref 98–111)
Creatinine, Ser: 0.6 mg/dL (ref 0.44–1.00)
GFR, Estimated: >60 mL/min
Glucose, Bld: 94 mg/dL (ref 70–99)
Potassium: 3.5 mmol/L (ref 3.5–5.1)
Sodium: 142 mmol/L (ref 135–145)
Total Bilirubin: 0.7 mg/dL (ref 0.0–1.2)
Total Protein: 7.3 g/dL (ref 6.5–8.1)

## 2024-05-07 LAB — CBC WITH DIFFERENTIAL/PLATELET
Abs Immature Granulocytes: 0.03 K/uL (ref 0.00–0.07)
Basophils Absolute: 0.1 K/uL (ref 0.0–0.1)
Basophils Relative: 0 %
Eosinophils Absolute: 0.2 K/uL (ref 0.0–0.5)
Eosinophils Relative: 2 %
HCT: 33.2 % — ABNORMAL LOW (ref 36.0–46.0)
Hemoglobin: 10.6 g/dL — ABNORMAL LOW (ref 12.0–15.0)
Immature Granulocytes: 0 %
Lymphocytes Relative: 13 %
Lymphs Abs: 1.5 K/uL (ref 0.7–4.0)
MCH: 28.2 pg (ref 26.0–34.0)
MCHC: 31.9 g/dL (ref 30.0–36.0)
MCV: 88.3 fL (ref 80.0–100.0)
Monocytes Absolute: 0.7 K/uL (ref 0.1–1.0)
Monocytes Relative: 6 %
Neutro Abs: 9.3 K/uL — ABNORMAL HIGH (ref 1.7–7.7)
Neutrophils Relative %: 79 %
Platelets: 321 K/uL (ref 150–400)
RBC: 3.76 MIL/uL — ABNORMAL LOW (ref 3.87–5.11)
RDW: 14.6 % (ref 11.5–15.5)
WBC: 11.9 K/uL — ABNORMAL HIGH (ref 4.0–10.5)
nRBC: 0 % (ref 0.0–0.2)

## 2024-05-07 LAB — URINALYSIS, ROUTINE W REFLEX MICROSCOPIC
Bilirubin Urine: NEGATIVE
Glucose, UA: NEGATIVE mg/dL
Ketones, ur: NEGATIVE mg/dL
Nitrite: NEGATIVE
Protein, ur: 100 mg/dL — AB
RBC / HPF: >50 RBC/hpf (ref 0–5)
Specific Gravity, Urine: 1.015 (ref 1.005–1.030)
WBC, UA: >50 WBC/hpf (ref 0–5)
pH: 7 (ref 5.0–8.0)

## 2024-05-07 LAB — LIPASE, BLOOD: Lipase: 18 U/L (ref 11–51)

## 2024-05-07 MED ORDER — CIPROFLOXACIN HCL 500 MG PO TABS: 500.0000 mg | ORAL_TABLET | Freq: Two times a day (BID) | ORAL | 0 refills | Status: AC

## 2024-05-07 MED ORDER — CIPROFLOXACIN HCL 250 MG PO TABS
500.0000 mg | ORAL_TABLET | Freq: Once | ORAL | Status: AC
  Administered 2024-05-07: 500 mg via ORAL
  Filled 2024-05-07: qty 2

## 2024-05-07 MED ORDER — METRONIDAZOLE 500 MG PO TABS: 500.0000 mg | ORAL_TABLET | Freq: Two times a day (BID) | ORAL | 0 refills | Status: AC

## 2024-05-07 MED ORDER — METRONIDAZOLE 500 MG PO TABS
500.0000 mg | ORAL_TABLET | Freq: Once | ORAL | Status: AC
  Administered 2024-05-07: 500 mg via ORAL
  Filled 2024-05-07: qty 1

## 2024-05-07 NOTE — ED Triage Notes (Signed)
 Pt arrived via POV c/o hematuria that she noticed this morning. Pt also reports dysuria.

## 2024-05-07 NOTE — ED Provider Notes (Signed)
 " [place] EMERGENCY DEPARTMENT AT [facility] Provider Note   CSN: [id number] Arrival date & time: 05/07/24  [id number]     Patient presents with: Abdominal Pain   Phyllis Santos is a 83 y.o. female.   83 year old female with past medical history of hypertension and hyperlipidemia presenting to the emergency department today with abdominal discomfort and hematuria.  Patient states that she woke up this morning with gross hematuria.  Is having some dysuria as well.  She denies any fevers.  Has been having normal bowel movements.  Denies any vomiting.  She came to the emergency department today for further evaluation regarding this due to ongoing symptoms.   Abdominal Pain      Prior to Admission medications  Medication Sig Start Date End Date Taking? Authorizing Provider  ciprofloxacin  (CIPRO ) 500 MG tablet Take 1 tablet (500 mg total) by mouth every 12 (twelve) hours. 05/07/24  Yes Ula Prentice SAUNDERS, MD  metroNIDAZOLE  (FLAGYL ) 500 MG tablet Take 1 tablet (500 mg total) by mouth 2 (two) times daily. 05/07/24  Yes Ula Prentice SAUNDERS, MD  acetaminophen  (TYLENOL ) 500 MG tablet Take 500 mg by mouth every 6 (six) hours as needed for moderate pain.    [provider]  aspirin EC 81 MG tablet Take 81 mg by mouth daily.    [provider]  atorvastatin (LIPITOR) 10 MG tablet Take 10 mg by mouth daily.    [provider]  Calcium Carbonate-Vitamin D 600-400 MG-UNIT tablet Take 1 tablet by mouth daily.    [provider]  Cholecalciferol (VITAMIN D) 125 MCG (5000 UT) CAPS Take 5,000 Units by mouth daily.    [provider]  Homeopathic Products [facility] ALLERGY EYE RELIEF OP) Place 1 drop into both eyes daily as needed (watery eyes).    [provider]  hydrochlorothiazide (HYDRODIURIL) 25 MG tablet Take 25 mg by mouth daily.    [provider]  HYDROcodone -acetaminophen  (NORCO/VICODIN) 5-325 MG tablet Take 0.5-1 tablets by mouth  every 4 (four) hours as needed. 03/26/22   Idol, Julie, PA-C  losartan (COZAAR) 100 MG tablet Take 100 mg by mouth daily.    [provider]  polyethylene glycol-electrolytes (NULYTELY) 420 g solution As directed 10/04/21   Rourk, Lamar HERO, MD  psyllium (METAMUCIL) 58.6 % powder Take 1 packet by mouth daily. 5 ml    [provider]  sennosides-docusate sodium (SENOKOT-S) 8.6-50 MG tablet Take 1 tablet by mouth at bedtime.    [provider]  vitamin C (ASCORBIC ACID) 500 MG tablet Take 500 mg by mouth daily.    [provider]    Allergies: Penicillins, Codeine, and Levaquin [levofloxacin in d5w]    Review of Systems  Gastrointestinal:  Positive for abdominal pain.    Updated Vital Signs BP (!) 139/56 (BP Location: Left Arm)   Pulse 67   Temp 98.1 F (36.7 C) (Oral)   Resp 16   Ht 5' 2 (1.575 m)   Wt 93 kg   SpO2 99%   BMI 37.50 kg/m   Physical Exam Vitals and nursing note reviewed.   Gen: NAD Eyes: PERRL, EOMI HEENT: no oropharyngeal swelling Neck: trachea midline Resp: clear to auscultation bilaterally Card: RRR, no murmurs, rubs, or gallops Abd: Mild suprapubic tenderness, no guarding or rebound Extremities: no calf tenderness, no edema Vascular: 2+ radial pulses bilaterally, 2+ DP pulses bilaterally Skin: no rashes Psyc: acting appropriately   (all labs ordered are listed, but only abnormal  results are displayed) Labs Reviewed  URINALYSIS, ROUTINE W REFLEX MICROSCOPIC - Abnormal; Notable for the following components:      Result Value   APPearance CLOUDY (*)    Hgb urine dipstick LARGE (*)    Protein, ur 100 (*)    Leukocytes,Ua LARGE (*)    Bacteria, UA RARE (*)    All other components within normal limits  CBC WITH DIFFERENTIAL/PLATELET - Abnormal; Notable for the following components:   WBC 11.9 (*)    RBC 3.76 (*)    Hemoglobin 10.6 (*)    HCT 33.2 (*)    Neutro Abs 9.3 (*)    All other components within normal  limits  COMPREHENSIVE METABOLIC PANEL WITH GFR  LIPASE, BLOOD    EKG: None  Radiology: CT ABDOMEN PELVIS WO CONTRAST Result Date: 05/07/2024 CLINICAL DATA:  Abdominal pain, hematuria. EXAM: CT ABDOMEN AND PELVIS WITHOUT CONTRAST TECHNIQUE: Multidetector CT imaging of the abdomen and pelvis was performed following the standard protocol without IV contrast. RADIATION DOSE REDUCTION: This exam was performed according to the departmental dose-optimization program which includes automated exposure control, adjustment of the mA and/or kV according to patient size and/or use of iterative reconstruction technique. COMPARISON:  MR abdomen 01/17/2023 and CT abdomen pelvis 01/02/2023. FINDINGS: Lower chest: Minimal subsegmental atelectasis or scarring. Heart is mildly enlarged. No pericardial or pleural effusion. Distal esophagus is grossly unremarkable. Hepatobiliary: Liver is borderline enlarged, 17.9 cm. Scattered hepatic cysts. Vague low-attenuation lesions in the posterior right hepatic lobe and inferiorly in the left hepatic lobe, characterized as hemangiomas on MR abdomen 01/17/2023. Cholecystectomy. No biliary ductal dilatation. Pancreas: Negative. Spleen: Negative. Adrenals/Urinary Tract: Adrenal glands are unremarkable. 1.5 x 1.9 cm lesion off the medial interpolar right kidney (2/30), better characterized on MR abdomen 01/17/2023. Small low-attenuation lesion in the left kidney, characterized as a cyst on that study. Kidneys are otherwise unremarkable. Ureters are decompressed. Bladder is low in volume with probable wall thickening and with mild surrounding inflammatory haziness and stranding. Stomach/Bowel: Very mild vague haziness associated with the distal descending colon (2/52), with numerous diverticula. Stomach, small bowel and colon are otherwise unremarkable. Appendectomy. Vascular/Lymphatic: Atherosclerotic calcification of the aorta. No pathologically enlarged lymph nodes. Small bilateral  inguinal lymph nodes. Reproductive: Fibroid uterus.  No adnexal mass. Other: No free fluid. Hyperdense subcutaneous nodule along the left paramidline ventral abdomen measures 1.6 x 2.4 cm (2/20), as on MR abdomen 01/17/2023, unchanged. Mesenteries and peritoneum are unremarkable. Musculoskeletal: Degenerative changes in the spine. Mild levoconvex curvature of the lumbar spine. IMPRESSION: 1. Probable mild bladder wall thickening with surrounding inflammatory haziness and stranding, indicative of cystitis. 2. Vague mild haziness associated with the distal descending colon. Difficult to exclude diverticulitis. Please correlate clinically. 3. 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 4.  Aortic atherosclerosis (ICD10-I70.0). Electronically Signed   By: Newell Eke M.D.   On: 05/07/2024 10:58     Procedures   Medications Ordered in the ED  ciprofloxacin  (CIPRO ) tablet 500 mg (has no administration in time range)  metroNIDAZOLE  (FLAGYL ) tablet 500 mg (has no administration in time range)                                    Medical Decision Making 83 year old female with past medical history of hypertension and hyperlipidemia presenting to the emergency department today with abdominal pain  and hematuria.  I will further evaluate the patient here with urinalysis as well as basic labs and a CT without contrast to evaluate for malignancy or ureterolithiasis.  The patient is denying any fullness or discomfort when she is not urinating so does not appear to be in clot retention.  The patient was found to have a UTI on CT scan and urinalysis.  There was some findings equivocal for diverticulitis as well.  The patient does have penicillin allergy.  Will cover her with ciprofloxacin  for the UTI.  Will add on Flagyl  for possible mild diverticulitis.  The patient mains well-appearing.  She is discharged with return  precautions.  Amount and/or Complexity of Data Reviewed Labs: ordered. Radiology: ordered.  Risk Prescription drug management.        Final diagnoses:  Cystitis    ED Discharge Orders          Ordered    ciprofloxacin  (CIPRO ) 500 MG tablet  Every 12 hours        05/07/24 1104    metroNIDAZOLE  (FLAGYL ) 500 MG tablet  2 times daily        05/07/24 1104               Ula Prentice SAUNDERS, MD 05/07/24 1105  "

## 2024-05-07 NOTE — Discharge Instructions (Addendum)
 Your CT scan showed that you have a UTI and your urinalysis is consistent with this.  Please take the ciprofloxacin  and Flagyl .  There was some mild inflammation of your colon as well which that should treat.  Please follow-up with your doctor for reevaluation and return to the ER for worsening symptoms.

## 2024-07-22 ENCOUNTER — Other Ambulatory Visit (HOSPITAL_COMMUNITY)

## 2024-08-12 ENCOUNTER — Ambulatory Visit: Admitting: Urology
